# Patient Record
Sex: Female | Born: 1990 | Race: Black or African American | Hispanic: No | Marital: Single | Smoking: Former smoker
Health system: Southern US, Community
[De-identification: ages and names within clinical notes are randomized; demographics above are authoritative.]

## PROBLEM LIST (undated history)

## (undated) ENCOUNTER — Inpatient Hospital Stay (HOSPITAL_COMMUNITY): Payer: Self-pay

## (undated) DIAGNOSIS — O24419 Gestational diabetes mellitus in pregnancy, unspecified control: Secondary | ICD-10-CM

## (undated) DIAGNOSIS — A599 Trichomoniasis, unspecified: Secondary | ICD-10-CM

## (undated) HISTORY — PX: INDUCED ABORTION: SHX677

---
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
## 2010-01-19 ENCOUNTER — Emergency Department (HOSPITAL_COMMUNITY): Admission: EM | Admit: 2010-01-19 | Discharge: 2010-01-19 | Payer: Self-pay | Admitting: Emergency Medicine

## 2010-03-03 ENCOUNTER — Ambulatory Visit: Payer: Self-pay | Admitting: Obstetrics and Gynecology

## 2010-03-03 ENCOUNTER — Inpatient Hospital Stay (HOSPITAL_COMMUNITY): Admission: AD | Admit: 2010-03-03 | Discharge: 2010-03-03 | Payer: Self-pay | Admitting: Obstetrics & Gynecology

## 2010-06-03 ENCOUNTER — Emergency Department (HOSPITAL_COMMUNITY): Admission: EM | Admit: 2010-06-03 | Discharge: 2010-06-03 | Payer: Self-pay | Admitting: Emergency Medicine

## 2010-06-28 ENCOUNTER — Ambulatory Visit: Payer: Self-pay | Admitting: Family

## 2010-06-28 ENCOUNTER — Inpatient Hospital Stay (HOSPITAL_COMMUNITY): Admission: AD | Admit: 2010-06-28 | Discharge: 2010-06-29 | Payer: Self-pay | Admitting: Obstetrics & Gynecology

## 2010-06-30 ENCOUNTER — Inpatient Hospital Stay (HOSPITAL_COMMUNITY): Admission: AD | Admit: 2010-06-30 | Discharge: 2010-06-30 | Payer: Self-pay | Admitting: Family Medicine

## 2010-06-30 ENCOUNTER — Ambulatory Visit: Payer: Self-pay | Admitting: Gynecology

## 2010-10-17 ENCOUNTER — Encounter: Payer: Self-pay | Admitting: Obstetrics & Gynecology

## 2010-12-10 LAB — WET PREP, GENITAL: Trich, Wet Prep: NONE SEEN

## 2010-12-10 LAB — CBC
HCT: 34.8 % — ABNORMAL LOW (ref 36.0–46.0)
MCH: 30.5 pg (ref 26.0–34.0)
MCV: 90 fL (ref 78.0–100.0)
Platelets: 193 10*3/uL (ref 150–400)
RBC: 3.86 MIL/uL — ABNORMAL LOW (ref 3.87–5.11)
WBC: 5.4 10*3/uL (ref 4.0–10.5)

## 2010-12-10 LAB — GC/CHLAMYDIA PROBE AMP, GENITAL
Chlamydia, DNA Probe: NEGATIVE
GC Probe Amp, Genital: NEGATIVE

## 2010-12-14 LAB — URINE MICROSCOPIC-ADD ON

## 2010-12-14 LAB — URINALYSIS, ROUTINE W REFLEX MICROSCOPIC
Bilirubin Urine: NEGATIVE
Glucose, UA: NEGATIVE mg/dL
Ketones, ur: 15 mg/dL — AB
Protein, ur: NEGATIVE mg/dL
Urobilinogen, UA: 0.2 mg/dL (ref 0.0–1.0)

## 2010-12-14 LAB — WET PREP, GENITAL: Yeast Wet Prep HPF POC: NONE SEEN

## 2010-12-14 LAB — CBC
HCT: 33.5 % — ABNORMAL LOW (ref 36.0–46.0)
Hemoglobin: 11.5 g/dL — ABNORMAL LOW (ref 12.0–15.0)
MCV: 89.8 fL (ref 78.0–100.0)
RDW: 13.1 % (ref 11.5–15.5)

## 2010-12-14 LAB — POCT PREGNANCY, URINE: Preg Test, Ur: NEGATIVE

## 2010-12-14 LAB — GC/CHLAMYDIA PROBE AMP, GENITAL
Chlamydia, DNA Probe: NEGATIVE
GC Probe Amp, Genital: NEGATIVE

## 2010-12-15 LAB — URINALYSIS, ROUTINE W REFLEX MICROSCOPIC
Bilirubin Urine: NEGATIVE
Hgb urine dipstick: NEGATIVE
Ketones, ur: NEGATIVE mg/dL
Specific Gravity, Urine: 1.011 (ref 1.005–1.030)
Urobilinogen, UA: 0.2 mg/dL (ref 0.0–1.0)

## 2010-12-15 LAB — URINE MICROSCOPIC-ADD ON

## 2010-12-15 LAB — POCT PREGNANCY, URINE: Preg Test, Ur: NEGATIVE

## 2010-12-29 LAB — POCT PREGNANCY, URINE: Preg Test, Ur: NEGATIVE

## 2011-01-23 ENCOUNTER — Emergency Department (HOSPITAL_COMMUNITY): Payer: 59

## 2011-01-23 ENCOUNTER — Emergency Department (HOSPITAL_COMMUNITY)
Admission: EM | Admit: 2011-01-23 | Discharge: 2011-01-23 | Disposition: A | Discharge status: Home / Self Care | Payer: 59 | Attending: Emergency Medicine | Admitting: Emergency Medicine

## 2011-01-23 DIAGNOSIS — O99891 Other specified diseases and conditions complicating pregnancy: Secondary | ICD-10-CM | POA: Insufficient documentation

## 2011-01-23 DIAGNOSIS — R109 Unspecified abdominal pain: Secondary | ICD-10-CM | POA: Insufficient documentation

## 2011-01-23 LAB — URINALYSIS, ROUTINE W REFLEX MICROSCOPIC
Ketones, ur: NEGATIVE mg/dL
Nitrite: NEGATIVE
Urobilinogen, UA: 1 mg/dL (ref 0.0–1.0)
pH: 7 (ref 5.0–8.0)

## 2011-01-23 LAB — DIFFERENTIAL
Basophils Absolute: 0 10*3/uL (ref 0.0–0.1)
Basophils Relative: 0 % (ref 0–1)
Lymphocytes Relative: 37 % (ref 12–46)
Monocytes Absolute: 0.4 10*3/uL (ref 0.1–1.0)
Neutro Abs: 3.6 10*3/uL (ref 1.7–7.7)
Neutrophils Relative %: 52 % (ref 43–77)

## 2011-01-23 LAB — WET PREP, GENITAL
Trich, Wet Prep: NONE SEEN
WBC, Wet Prep HPF POC: NONE SEEN

## 2011-01-23 LAB — PREGNANCY, URINE: Preg Test, Ur: POSITIVE

## 2011-01-23 LAB — CBC
HCT: 33.6 % — ABNORMAL LOW (ref 36.0–46.0)
Hemoglobin: 11.3 g/dL — ABNORMAL LOW (ref 12.0–15.0)
RBC: 3.93 MIL/uL (ref 3.87–5.11)
WBC: 6.8 10*3/uL (ref 4.0–10.5)

## 2011-01-23 LAB — ABO/RH: ABO/RH(D): O POS

## 2011-01-23 LAB — HCG, QUANTITATIVE, PREGNANCY: hCG, Beta Chain, Quant, S: 711 m[IU]/mL — ABNORMAL HIGH (ref <5)

## 2011-01-23 LAB — POCT I-STAT, CHEM 8
Calcium, Ion: 1.05 mmol/L — ABNORMAL LOW (ref 1.12–1.32)
Chloride: 107 mEq/L (ref 96–112)
HCT: 36 % (ref 36.0–46.0)
Hemoglobin: 12.2 g/dL (ref 12.0–15.0)
Potassium: 3.5 mEq/L (ref 3.5–5.1)

## 2011-01-23 LAB — URINE MICROSCOPIC-ADD ON

## 2011-01-25 LAB — GC/CHLAMYDIA PROBE AMP, GENITAL
Chlamydia, DNA Probe: NEGATIVE
GC Probe Amp, Genital: NEGATIVE

## 2011-05-28 ENCOUNTER — Encounter (HOSPITAL_COMMUNITY): Payer: Self-pay | Admitting: *Deleted

## 2011-05-28 ENCOUNTER — Inpatient Hospital Stay (HOSPITAL_COMMUNITY)
Admission: AD | Admit: 2011-05-28 | Discharge: 2011-05-28 | Disposition: A | Discharge status: Home / Self Care | Payer: 59 | Source: Ambulatory Visit | Attending: Obstetrics and Gynecology | Admitting: Obstetrics and Gynecology

## 2011-05-28 DIAGNOSIS — M545 Low back pain, unspecified: Secondary | ICD-10-CM | POA: Insufficient documentation

## 2011-05-28 DIAGNOSIS — M899 Disorder of bone, unspecified: Secondary | ICD-10-CM | POA: Insufficient documentation

## 2011-05-28 DIAGNOSIS — M259 Joint disorder, unspecified: Secondary | ICD-10-CM | POA: Insufficient documentation

## 2011-05-28 DIAGNOSIS — Z3201 Encounter for pregnancy test, result positive: Secondary | ICD-10-CM

## 2011-05-28 DIAGNOSIS — J45909 Unspecified asthma, uncomplicated: Secondary | ICD-10-CM | POA: Insufficient documentation
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
## 2011-06-10 NOTE — ED Provider Notes (Signed)
Agree with above note.  Jeanenne Licea 06/10/2011 8:32 AM   

## 2011-06-16 ENCOUNTER — Inpatient Hospital Stay (HOSPITAL_COMMUNITY)
Admission: AD | Admit: 2011-06-16 | Discharge: 2011-06-16 | Disposition: A | Discharge status: Home / Self Care | Payer: 59 | Source: Ambulatory Visit | Attending: Obstetrics & Gynecology | Admitting: Obstetrics & Gynecology

## 2011-06-16 ENCOUNTER — Encounter (HOSPITAL_COMMUNITY): Payer: Self-pay | Admitting: *Deleted

## 2011-06-16 DIAGNOSIS — R109 Unspecified abdominal pain: Secondary | ICD-10-CM | POA: Insufficient documentation

## 2011-06-16 DIAGNOSIS — Z3490 Encounter for supervision of normal pregnancy, unspecified, unspecified trimester: Secondary | ICD-10-CM

## 2011-06-16 DIAGNOSIS — Z8759 Personal history of other complications of pregnancy, childbirth and the puerperium: Secondary | ICD-10-CM

## 2011-06-16 DIAGNOSIS — N760 Acute vaginitis: Secondary | ICD-10-CM | POA: Diagnosis present

## 2011-06-16 DIAGNOSIS — B9689 Other specified bacterial agents as the cause of diseases classified elsewhere: Secondary | ICD-10-CM | POA: Diagnosis present

## 2011-06-16 DIAGNOSIS — O2390 Unspecified genitourinary tract infection in pregnancy, unspecified trimester: Secondary | ICD-10-CM | POA: Insufficient documentation

## 2011-06-16 DIAGNOSIS — A499 Bacterial infection, unspecified: Secondary | ICD-10-CM

## 2011-06-16 LAB — URINALYSIS, ROUTINE W REFLEX MICROSCOPIC
Glucose, UA: NEGATIVE mg/dL
Ketones, ur: NEGATIVE mg/dL
Leukocytes, UA: NEGATIVE
Protein, ur: NEGATIVE mg/dL
Urobilinogen, UA: 0.2 mg/dL (ref 0.0–1.0)

## 2011-06-16 LAB — WET PREP, GENITAL: Trich, Wet Prep: NONE SEEN

## 2011-06-16 MED ORDER — METRONIDAZOLE 500 MG PO TABS: 500.0000 mg | ORAL_TABLET | Freq: Two times a day (BID) | ORAL | Status: AC

## 2011-06-16 NOTE — ED Provider Notes (Signed)
History     Chief Complaint  Patient presents with  . Abdominal Pain   HPI Feeling suprapubic/pelvic pressure today, no bleeding or pain. H/O SAB x 2 around [redacted] wks EGA, concerned about another miscarriage.   OB History    Grav Para Term Preterm Abortions TAB SAB Ect Mult Living   3    2 0 2 0 0 0      Past Medical History  Diagnosis Date  . Asthma     Past Surgical History  Procedure Date  . No past surgeries     No family history on file.  History  Substance Use Topics  . Smoking status: Never Smoker   . Smokeless tobacco: Not on file  . Alcohol Use: No    Allergies: No Known Allergies  Prescriptions prior to admission  Medication Sig Dispense Refill  . ibuprofen (ADVIL,MOTRIN) 800 MG tablet Take 800 mg by mouth daily as needed. For pain      . prenatal vitamin w/FE, FA (PRENATAL 1 + 1) 27-1 MG TABS Take 1 tablet by mouth daily.          Review of Systems  Constitutional: Negative.   Respiratory: Negative.   Cardiovascular: Negative.   Gastrointestinal: Negative for nausea, vomiting, abdominal pain, diarrhea and constipation.  Genitourinary: Negative for dysuria, urgency, frequency, hematuria and flank pain.       Negative for vaginal bleeding, cramping/contractions  Musculoskeletal: Negative.   Neurological: Negative.   Psychiatric/Behavioral: Negative.    Physical Exam   Blood pressure 125/73, pulse 81, temperature 98.4 F (36.9 C), temperature source Oral, resp. rate 16, height 5\' 7"  (1.702 m), weight 67.699 kg (149 lb 4 oz), last menstrual period 04/02/2011.  Physical Exam  Nursing note and vitals reviewed. Constitutional: She is oriented to person, place, and time. She appears well-developed and well-nourished. No distress.  HENT:  Head: Normocephalic and atraumatic.  Cardiovascular: Normal rate.   Respiratory: Effort normal.  GI: Soft. Bowel sounds are normal. She exhibits no mass. There is no tenderness. There is no rebound and no guarding.    Genitourinary: There is no rash or lesion on the right labia. There is no rash or lesion on the left labia. Uterus is not tender. Enlarged: Size c/w dates. Cervix exhibits no motion tenderness, no discharge and no friability. Right adnexum displays no mass, no tenderness and no fullness. Left adnexum displays no mass, no tenderness and no fullness. No tenderness or bleeding around the vagina. Vaginal discharge (white) found.  Musculoskeletal: Normal range of motion.  Neurological: She is alert and oriented to person, place, and time.  Skin: Skin is warm and dry.  Psychiatric: She has a normal mood and affect.   + cardiac activity on bedside u/s  MAU Course  Procedures  Results for orders placed during the hospital encounter of 06/16/11 (from the past 24 hour(s))  URINALYSIS, ROUTINE W REFLEX MICROSCOPIC     Status: Normal   Collection Time   06/16/11  1:56 PM      Component Value Range   Color, Urine YELLOW  YELLOW    Appearance CLEAR  CLEAR    Specific Gravity, Urine 1.020  1.005 - 1.030    pH 7.0  5.0 - 8.0    Glucose, UA NEGATIVE  NEGATIVE (mg/dL)   Hgb urine dipstick NEGATIVE  NEGATIVE    Bilirubin Urine NEGATIVE  NEGATIVE    Ketones, ur NEGATIVE  NEGATIVE (mg/dL)   Protein, ur NEGATIVE  NEGATIVE (mg/dL)  Urobilinogen, UA 0.2  0.0 - 1.0 (mg/dL)   Nitrite NEGATIVE  NEGATIVE    Leukocytes, UA NEGATIVE  NEGATIVE      Assessment and Plan  20 y.o. [id number] at [redacted]w[redacted]d Bacterial vaginosis - rx Flagyl 500 mg bid x 7 days Start prenatal care as soon as possible  Whitney Howell 06/16/2011, 2:34 PM

## 2011-06-16 NOTE — Progress Notes (Signed)
Pt states 2 prior miscarriages around 10 weeks, is 10 weeks now, feels suprapubic pressure, denies pain or bleeding or abnormal vaginal discharge. Concerned she may miscarry again.

## 2011-06-16 NOTE — Progress Notes (Signed)
Pt in worried about a miscarriage.  Has been feeling a lot of pelvic pressure today since 0500.  Denies any bleeding or cramping.  Hx of 2 SAB at 10 weeks.

## 2011-06-17 LAB — GC/CHLAMYDIA PROBE AMP, GENITAL: GC Probe Amp, Genital: NEGATIVE

## 2011-09-06 ENCOUNTER — Encounter (HOSPITAL_COMMUNITY): Payer: Self-pay | Admitting: *Deleted

## 2011-09-06 ENCOUNTER — Inpatient Hospital Stay (HOSPITAL_COMMUNITY)
Admission: AD | Admit: 2011-09-06 | Discharge: 2011-09-06 | Disposition: A | Discharge status: Home / Self Care | Payer: 59 | Source: Ambulatory Visit | Attending: Obstetrics & Gynecology | Admitting: Obstetrics & Gynecology

## 2011-09-06 DIAGNOSIS — O219 Vomiting of pregnancy, unspecified: Secondary | ICD-10-CM

## 2011-09-06 DIAGNOSIS — O99891 Other specified diseases and conditions complicating pregnancy: Secondary | ICD-10-CM | POA: Insufficient documentation

## 2011-09-06 DIAGNOSIS — J069 Acute upper respiratory infection, unspecified: Secondary | ICD-10-CM | POA: Insufficient documentation

## 2011-09-06 DIAGNOSIS — O210 Mild hyperemesis gravidarum: Secondary | ICD-10-CM | POA: Insufficient documentation

## 2011-09-06 HISTORY — DX: Gestational diabetes mellitus in pregnancy, unspecified control: O24.419

## 2011-09-06 LAB — URINALYSIS, ROUTINE W REFLEX MICROSCOPIC
Glucose, UA: NEGATIVE mg/dL
Hgb urine dipstick: NEGATIVE
Protein, ur: NEGATIVE mg/dL
Specific Gravity, Urine: >1.03 — ABNORMAL HIGH (ref 1.005–1.030)
Urobilinogen, UA: 0.2 mg/dL (ref 0.0–1.0)

## 2011-09-06 LAB — CBC
HCT: 29.2 % — ABNORMAL LOW (ref 36.0–46.0)
Platelets: 220 10*3/uL (ref 150–400)
RBC: 3.3 MIL/uL — ABNORMAL LOW (ref 3.87–5.11)
RDW: 13.5 % (ref 11.5–15.5)
WBC: 8 10*3/uL (ref 4.0–10.5)

## 2011-09-06 LAB — DIFFERENTIAL
Basophils Absolute: 0 10*3/uL (ref 0.0–0.1)
Lymphocytes Relative: 20 % (ref 12–46)
Lymphs Abs: 1.6 10*3/uL (ref 0.7–4.0)
Neutro Abs: 5.8 10*3/uL (ref 1.7–7.7)

## 2011-09-06 LAB — COMPREHENSIVE METABOLIC PANEL
AST: 15 U/L (ref 0–37)
Alkaline Phosphatase: 64 U/L (ref 39–117)
BUN: 8 mg/dL (ref 6–23)
CO2: 24 mEq/L (ref 19–32)
Chloride: 100 mEq/L (ref 96–112)
Creatinine, Ser: 0.63 mg/dL (ref 0.50–1.10)
GFR calc non Af Amer: >90 mL/min (ref >90)
Total Bilirubin: 0.2 mg/dL — ABNORMAL LOW (ref 0.3–1.2)

## 2011-09-06 LAB — URINE MICROSCOPIC-ADD ON

## 2011-09-06 MED ORDER — AZITHROMYCIN 250 MG PO TABS: ORAL_TABLET | ORAL | Status: AC

## 2011-09-06 MED ORDER — ONDANSETRON 8 MG PO TBDP
8.0000 mg | ORAL_TABLET | Freq: Once | ORAL | Status: AC
  Administered 2011-09-06: 8 mg via ORAL
  Filled 2011-09-06: qty 1

## 2011-09-06 NOTE — Progress Notes (Signed)
Pt tolerating juice and crackers.

## 2011-09-06 NOTE — Progress Notes (Signed)
Pt had an upper respiratory infection for one month, and never went to MD to be checked.  Cough continues with green sputum, unable to keep fluids or food down for two days.

## 2011-09-06 NOTE — ED Provider Notes (Signed)
History     Chief Complaint  Patient presents with  . Emesis  . Headache  . Abdominal Pain   HPI 20 y.o. G3P0020 at [redacted]w[redacted]d. Congestion, sore throat and productive cough x 1 month, no fever, not taking anything for symptoms.  N/V x 2 days, has zofran at home but has not tried it. + fetal movement.     Past Medical History  Diagnosis Date  . Asthma   . Shortness of breath   . Gestational diabetes     Past Surgical History  Procedure Date  . No past surgeries     Family History  Problem Relation Age of Onset  . Cancer Mother   . Asthma Father   . Asthma Sister   . Diabetes Brother   . Asthma Brother   . Cancer Maternal Grandmother   . Heart disease Maternal Grandfather   . Hypertension Maternal Grandfather   . Diabetes Paternal Grandfather     History  Substance Use Topics  . Smoking status: Never Smoker   . Smokeless tobacco: Not on file  . Alcohol Use: No    Allergies: No Known Allergies  Prescriptions prior to admission  Medication Sig Dispense Refill  . Fe-Succ Ac-B Cmplx-C-Ca-FA (IROSPAN 24/6 PO) Take 1 tablet by mouth daily.        Burnis Medin w/o A Vit-FeFum-FePo-FA (CONCEPT OB PO) Take 1 tablet by mouth daily.          Review of Systems  Constitutional: Negative for fever.  HENT: Positive for congestion and sore throat.   Respiratory: Positive for cough.   Cardiovascular: Negative.   Gastrointestinal: Positive for nausea, vomiting and abdominal pain. Negative for diarrhea and constipation.  Genitourinary: Negative for dysuria, urgency, frequency, hematuria and flank pain.       Negative for vaginal bleeding, cramping/contractions  Musculoskeletal: Negative.   Neurological: Negative.   Psychiatric/Behavioral: Negative.    Physical Exam   Blood pressure 116/59, pulse 97, temperature 99 F (37.2 C), temperature source Oral, resp. rate 16, height 5' 6.5" (1.689 m), weight 157 lb 9.6 oz (71.487 kg), last menstrual period 04/02/2011, SpO2  99.00%.  Physical Exam  Nursing note and vitals reviewed. Constitutional: She is oriented to person, place, and time. She appears well-developed and well-nourished. No distress.  HENT:  Right Ear: Tympanic membrane, external ear and ear canal normal.  Left Ear: Tympanic membrane, external ear and ear canal normal. Decreased hearing is noted.  Nose: Nose normal.  Mouth/Throat: Oropharynx is clear and moist. No oropharyngeal exudate.  Neck: Normal range of motion.  Cardiovascular: Normal rate, regular rhythm and normal heart sounds.   Respiratory: Effort normal. No stridor. No respiratory distress. She has no wheezes. She has no rales.  GI: Soft. She exhibits no distension and no mass. There is no tenderness. There is no rebound and no guarding.  Musculoskeletal: Normal range of motion.  Lymphadenopathy:    She has no cervical adenopathy.  Neurological: She is alert and oriented to person, place, and time.  Skin: Skin is warm and dry.  Psychiatric: She has a normal mood and affect.    MAU Course  Procedures Results for orders placed during the hospital encounter of 09/06/11 (from the past 24 hour(s))  URINALYSIS, ROUTINE W REFLEX MICROSCOPIC     Status: Abnormal   Collection Time   09/06/11  3:10 PM      Component Value Range   Color, Urine YELLOW  YELLOW    APPearance CLEAR  CLEAR  Specific Gravity, Urine >1.030 (*) 1.005 - 1.030    pH 6.5  5.0 - 8.0    Glucose, UA NEGATIVE  NEGATIVE (mg/dL)   Hgb urine dipstick NEGATIVE  NEGATIVE    Bilirubin Urine NEGATIVE  NEGATIVE    Ketones, ur NEGATIVE  NEGATIVE (mg/dL)   Protein, ur NEGATIVE  NEGATIVE (mg/dL)   Urobilinogen, UA 0.2  0.0 - 1.0 (mg/dL)   Nitrite NEGATIVE  NEGATIVE    Leukocytes, UA TRACE (*) NEGATIVE   URINE MICROSCOPIC-ADD ON     Status: Abnormal   Collection Time   09/06/11  3:10 PM      Component Value Range   Squamous Epithelial / LPF FEW (*) RARE    WBC, UA 3-6  <3 (WBC/hpf)   RBC / HPF 0-2  <3 (RBC/hpf)    Bacteria, UA MANY (*) RARE    Urine-Other MUCOUS PRESENT    CBC     Status: Abnormal   Collection Time   09/06/11  3:34 PM      Component Value Range   WBC 8.0  4.0 - 10.5 (K/uL)   RBC 3.30 (*) 3.87 - 5.11 (MIL/uL)   Hemoglobin 9.9 (*) 12.0 - 15.0 (g/dL)   HCT 16.1 (*) 09.6 - 46.0 (%)   MCV 88.5  78.0 - 100.0 (fL)   MCH 30.0  26.0 - 34.0 (pg)   MCHC 33.9  30.0 - 36.0 (g/dL)   RDW 04.5  40.9 - 81.1 (%)   Platelets 220  150 - 400 (K/uL)  DIFFERENTIAL     Status: Normal   Collection Time   09/06/11  3:34 PM      Component Value Range   Neutrophils Relative 73  43 - 77 (%)   Neutro Abs 5.8  1.7 - 7.7 (K/uL)   Lymphocytes Relative 20  12 - 46 (%)   Lymphs Abs 1.6  0.7 - 4.0 (K/uL)   Monocytes Relative 6  3 - 12 (%)   Monocytes Absolute 0.5  0.1 - 1.0 (K/uL)   Eosinophils Relative 2  0 - 5 (%)   Eosinophils Absolute 0.1  0.0 - 0.7 (K/uL)   Basophils Relative 0  0 - 1 (%)   Basophils Absolute 0.0  0.0 - 0.1 (K/uL)  COMPREHENSIVE METABOLIC PANEL     Status: Abnormal   Collection Time   09/06/11  3:34 PM      Component Value Range   Sodium 134 (*) 135 - 145 (mEq/L)   Potassium 3.8  3.5 - 5.1 (mEq/L)   Chloride 100  96 - 112 (mEq/L)   CO2 24  19 - 32 (mEq/L)   Glucose, Bld 81  70 - 99 (mg/dL)   BUN 8  6 - 23 (mg/dL)   Creatinine, Ser 9.14  0.50 - 1.10 (mg/dL)   Calcium 8.8  8.4 - 78.2 (mg/dL)   Total Protein 7.4  6.0 - 8.3 (g/dL)   Albumin 3.2 (*) 3.5 - 5.2 (g/dL)   AST 15  0 - 37 (U/L)   ALT 10  0 - 35 (U/L)   Alkaline Phosphatase 64  39 - 117 (U/L)   Total Bilirubin 0.2 (*) 0.3 - 1.2 (mg/dL)   GFR calc non Af Amer >90  >90 (mL/min)   GFR calc Af Amer >90  >90 (mL/min)   Feeling much better after Zofran ODT, tolerating juice, requesting crackers. If able to tolerate PO, will d/c home with abx for URI. Has Zofran at home.   Assessment and  Plan  20 y.o. G3P0020 at [redacted]w[redacted]d Nausea and vomiting - take Zofran as prescribed at home URI x 1 month - Z-pak F/U as  scheduled  Chrishun Scheer 09/06/2011, 4:03 PM

## 2011-09-06 NOTE — Progress Notes (Signed)
Patient states she has had some vomiting for about one month. Today has been unable to keep anything down and has a headache and mid abdominal pain.

## 2011-09-28 NOTE — L&D Delivery Note (Signed)
Delivery Note At 11:59 PM a viable female was delivered via Vaginal, Spontaneous Delivery (Presentation: Left Occiput Anterior).    Placenta status: Intact, Spontaneous.  Cord: 3 vessels with the following complications: None.  Cord pH: none.  Nuchal cord x 1 clamped/cut prior to delivery of anterior shoulder.  Anesthesia: Epidural  Episiotomy: None Lacerations: 1st degree;Labial Suture Repair: 2.0 3.0 vicryl rapide Est. Blood Loss (mL): 300 ml  Mom to postpartum.  Baby to nursery-stable.  JACKSON-MOORE,Caddie Randle A 01/06/2012, 12:27 AM

## 2011-12-16 ENCOUNTER — Inpatient Hospital Stay (HOSPITAL_COMMUNITY)
Admission: AD | Admit: 2011-12-16 | Discharge: 2011-12-16 | Disposition: A | Discharge status: Home / Self Care | Payer: 59 | Source: Ambulatory Visit | Attending: Obstetrics | Admitting: Obstetrics

## 2011-12-16 ENCOUNTER — Encounter (HOSPITAL_COMMUNITY): Payer: Self-pay | Admitting: *Deleted

## 2011-12-16 DIAGNOSIS — O99810 Abnormal glucose complicating pregnancy: Secondary | ICD-10-CM

## 2011-12-16 DIAGNOSIS — R0602 Shortness of breath: Secondary | ICD-10-CM

## 2011-12-16 DIAGNOSIS — R209 Unspecified disturbances of skin sensation: Secondary | ICD-10-CM | POA: Insufficient documentation

## 2011-12-16 NOTE — Discharge Instructions (Signed)
Shortness of Breath  Shortness of breath (dyspnea) is the feeling of uneasy breathing. Shortness of breath needs care right away.  HOME CARE    Do not smoke.   Avoid being around chemicals that may bother your breathing (such as paint fumes or dust).   Rest as needed. Slowly begin your usual activities.   Only take medicine as told by your doctor. Inhaled medicines or oxygen might be part of your treatment.   Follow up with your doctor as told. Waiting to do so or failure to follow up could result in worsening your condition and possible disability or death.   Understand what to do or who to call if your shortness of breath gets worse.  GET HELP RIGHT AWAY IF:    You get pain in your chest, shoulders, belly (abdomen), or jaw.   You cannot stop coughing or you start wheezing.   You cough up blood or thick mucus.   You can only speak with short words.   You have a fever.   You feel your heart racing or skipping beats.   You are not breathing better when you stop and rest.   Your condition does not improve in the time expected.   You have problems with medicines.  MAKE SURE YOU:   Understand these instructions.   Will watch your condition.   Will get help right away if you are not doing well or get worse.  Document Released: 03/01/2008 Document Revised: 09/02/2011 Document Reviewed: 07/30/2009  [employer] Patient Information 2012 [employer]

## 2011-12-16 NOTE — MAU Note (Signed)
resp unlabored.  Talks without need to pause or stop

## 2011-12-16 NOTE — MAU Provider Note (Signed)
History   Pt presents today c/o SOB. She states it feels as if she "can't catch a deep breath." She denies LOC, chest pain, blurry vision, or any other problems at this time.   CSN: [id number]  Arrival date and time: 12/16/11 1038   First Provider Initiated Contact with Patient 12/16/11 1131      No chief complaint on file.  HPI  OB History    Grav Para Term Preterm Abortions TAB SAB Ect Mult Living   3    2 0 2 0 0 0      Past Medical History  Diagnosis Date  . Asthma   . Shortness of breath   . Gestational diabetes     Past Surgical History  Procedure Date  . No past surgeries     Family History  Problem Relation Age of Onset  . Cancer Mother   . Asthma Father   . Asthma Sister   . Diabetes Brother   . Asthma Brother   . Cancer Maternal Grandmother   . Heart disease Maternal Grandfather   . Hypertension Maternal Grandfather   . Diabetes Paternal Grandfather     History  Substance Use Topics  . Smoking status: Never Smoker   . Smokeless tobacco: Not on file  . Alcohol Use: No    Allergies: No Known Allergies  Prescriptions prior to admission  Medication Sig Dispense Refill  . prenatal vitamin w/FE, FA (NATACHEW) 29-1 MG CHEW Chew 1 tablet by mouth every morning.        Review of Systems  Constitutional: Negative for fever and chills.  Eyes: Negative for blurred vision and double vision.  Respiratory: Positive for shortness of breath. Negative for cough, hemoptysis, sputum production and wheezing.   Cardiovascular: Negative for chest pain, palpitations, orthopnea, claudication and leg swelling.  Gastrointestinal: Negative for nausea, vomiting, abdominal pain, diarrhea and constipation.  Genitourinary: Negative for dysuria, urgency, frequency and hematuria.  Neurological: Negative for dizziness and headaches.  Psychiatric/Behavioral: Negative for depression and suicidal ideas.   Physical Exam   Blood pressure 124/76, pulse 92, temperature 97.1 F  (36.2 C), temperature source Oral, resp. rate 20, height 5\' 8"  (1.727 m), weight 181 lb (82.101 kg), last menstrual period 04/02/2011, SpO2 100.00%.  Physical Exam  Nursing note and vitals reviewed. Constitutional: She is oriented to person, place, and time. She appears well-developed and well-nourished. No distress.  HENT:  Head: Normocephalic and atraumatic.  Eyes: EOM are normal. Pupils are equal, round, and reactive to light.  Cardiovascular: Normal rate, regular rhythm and normal heart sounds.  Exam reveals no gallop and no friction rub.   No murmur heard. Respiratory: Effort normal and breath sounds normal. No respiratory distress. She has no wheezes. She has no rales. She exhibits no tenderness.  GI: Soft. She exhibits no distension. There is no tenderness. There is no rebound and no guarding.  Neurological: She is alert and oriented to person, place, and time.  Skin: Skin is warm and dry. She is not diaphoretic.  Psychiatric: She has a normal mood and affect. Her behavior is normal. Judgment and thought content normal.    MAU Course  Procedures  O2 sat 100% on room air.  NST reactive.  Discussed pt with Dr. Clearance Coots. Ok to dc to home and have pt f/u in office.   Assessment and Plan  SOB: pt with SOB from expansion restriction. No evidence of PE, pneumonia or any other respiratory issue. Discussed diet, activity, risks, and precautions. She  will f/u with Dr. Clearance Coots.  Clinton Gallant. Nasario Czerniak III, DrHSc, MPAS, PA-C  12/16/2011, 11:36 AM

## 2011-12-16 NOTE — MAU Note (Addendum)
For past wk has been waking up gasping for breath, hands and feet will be tingling.  Currently has tingling in feet, denies in hands or SOB.  Did feel short of breath when walking in, fine once she sat down. Denies HA, visual changes or epigastric pain. Does report increased swelling in hands and feet.

## 2012-01-04 ENCOUNTER — Encounter (HOSPITAL_COMMUNITY): Payer: Self-pay | Admitting: *Deleted

## 2012-01-04 ENCOUNTER — Inpatient Hospital Stay (HOSPITAL_COMMUNITY)
Admission: AD | Admit: 2012-01-04 | Discharge: 2012-01-04 | Disposition: A | Discharge status: Home / Self Care | Payer: 59 | Source: Ambulatory Visit | Attending: Obstetrics | Admitting: Obstetrics

## 2012-01-04 DIAGNOSIS — O479 False labor, unspecified: Secondary | ICD-10-CM | POA: Insufficient documentation

## 2012-01-04 LAB — URINALYSIS, ROUTINE W REFLEX MICROSCOPIC
Glucose, UA: NEGATIVE mg/dL
Ketones, ur: NEGATIVE mg/dL
Specific Gravity, Urine: 1.01 (ref 1.005–1.030)
pH: 6.5 (ref 5.0–8.0)

## 2012-01-04 LAB — URINE MICROSCOPIC-ADD ON

## 2012-01-04 NOTE — MAU Note (Signed)
Pt G3 P0 at 39.4wks having contractions every 5-12min and vomiting, unable to keep anything down.   Gest diabetic-diet control.

## 2012-01-04 NOTE — Discharge Instructions (Signed)
Pregnancy - Third Trimester The third trimester of pregnancy (the last 3 months) is a period of the most rapid growth for you and your baby. The baby approaches a length of 20 inches and a weight of 6 to 10 pounds. The baby is adding on fat and getting ready for life outside your body. While inside, babies have periods of sleeping and waking, suck their thumbs, and hiccups. You can often feel small contractions of the uterus. This is false labor. It is also called Braxton-Hicks contractions. This is like a practice for labor. The usual problems in this stage of pregnancy include more difficulty breathing, swelling of the hands and feet from water retention, and having to urinate more often because of the uterus and baby pressing on your bladder.  PRENATAL EXAMS  Blood work may continue to be done during prenatal exams. These tests are done to check on your health and the probable health of your baby. Blood work is used to follow your blood levels (hemoglobin). Anemia (low hemoglobin) is common during pregnancy. Iron and vitamins are given to help prevent this. You may also continue to be checked for diabetes. Some of the past blood tests may be done again.   The size of the uterus is measured during each visit. This makes sure your baby is growing properly according to your pregnancy dates.   Your blood pressure is checked every prenatal visit. This is to make sure you are not getting toxemia.   Your urine is checked every prenatal visit for infection, diabetes and protein.   Your weight is checked at each visit. This is done to make sure gains are happening at the suggested rate and that you and your baby are growing normally.   Sometimes, an ultrasound is performed to confirm the position and the proper growth and development of the baby. This is a test done that bounces harmless sound waves off the baby so your caregiver can more accurately determine due dates.   Discuss the type of pain  medication and anesthesia you will have during your labor and delivery.   Discuss the possibility and anesthesia if a Cesarean Section might be necessary.   Inform your caregiver if there is any mental or physical violence at home.  Sometimes, a specialized non-stress test, contraction stress test and biophysical profile are done to make sure the baby is not having a problem. Checking the amniotic fluid surrounding the baby is called an amniocentesis. The amniotic fluid is removed by sticking a needle into the belly (abdomen). This is sometimes done near the end of pregnancy if an early delivery is required. In this case, it is done to help make sure the baby's lungs are mature enough for the baby to live outside of the womb. If the lungs are not mature and it is unsafe to deliver the baby, an injection of cortisone medication is given to the mother 1 to 2 days before the delivery. This helps the baby's lungs mature and makes it safer to deliver the baby. CHANGES OCCURING IN THE THIRD TRIMESTER OF PREGNANCY Your body goes through many changes during pregnancy. They vary from person to person. Talk to your caregiver about changes you notice and are concerned about.  During the last trimester, you have probably had an increase in your appetite. It is normal to have cravings for certain foods. This varies from person to person and pregnancy to pregnancy.   You may begin to get stretch marks on your hips,   abdomen, and breasts. These are normal changes in the body during pregnancy. There are no exercises or medications to take which prevent this change.   Constipation may be treated with a stool softener or adding bulk to your diet. Drinking lots of fluids, fiber in vegetables, fruits, and whole grains are helpful.   Exercising is also helpful. If you have been very active up until your pregnancy, most of these activities can be continued during your pregnancy. If you have been less active, it is helpful  to start an exercise program such as walking. Consult your caregiver before starting exercise programs.   Avoid all smoking, alcohol, un-prescribed drugs, herbs and "street drugs" during your pregnancy. These chemicals affect the formation and growth of the baby. Avoid chemicals throughout the pregnancy to ensure the delivery of a healthy infant.   Backache, varicose veins and hemorrhoids may develop or get worse.   You will tire more easily in the third trimester, which is normal.   The baby's movements may be stronger and more often.   You may become short of breath easily.   Your belly button may stick out.   A yellow discharge may leak from your breasts called colostrum.   You may have a bloody mucus discharge. This usually occurs a few days to a week before labor begins.  HOME CARE INSTRUCTIONS   Keep your caregiver's appointments. Follow your caregiver's instructions regarding medication use, exercise, and diet.   During pregnancy, you are providing food for you and your baby. Continue to eat regular, well-balanced meals. Choose foods such as meat, fish, milk and other low fat dairy products, vegetables, fruits, and whole-grain breads and cereals. Your caregiver will tell you of the ideal weight gain.   A physical sexual relationship may be continued throughout pregnancy if there are no other problems such as early (premature) leaking of amniotic fluid from the membranes, vaginal bleeding, or belly (abdominal) pain.   Exercise regularly if there are no restrictions. Check with your caregiver if you are unsure of the safety of your exercises. Greater weight gain will occur in the last 2 trimesters of pregnancy. Exercising helps:   Control your weight.   Get you in shape for labor and delivery.   You lose weight after you deliver.   Rest a lot with legs elevated, or as needed for leg cramps or low back pain.   Wear a good support or jogging bra for breast tenderness during  pregnancy. This may help if worn during sleep. Pads or tissues may be used in the bra if you are leaking colostrum.   Do not use hot tubs, steam rooms, or saunas.   Wear your seat belt when driving. This protects you and your baby if you are in an accident.   Avoid raw meat, cat litter boxes and soil used by cats. These carry germs that can cause birth defects in the baby.   It is easier to loose urine during pregnancy. Tightening up and strengthening the pelvic muscles will help with this problem. You can practice stopping your urination while you are going to the bathroom. These are the same muscles you need to strengthen. It is also the muscles you would use if you were trying to stop from passing gas. You can practice tightening these muscles up 10 times a set and repeating this about 3 times per day. Once you know what muscles to tighten up, do not perform these exercises during urination. It is more likely   to cause an infection by backing up the urine.   Ask for help if you have financial, counseling or nutritional needs during pregnancy. Your caregiver will be able to offer counseling for these needs as well as refer you for other special needs.   Make a list of emergency phone numbers and have them available.   Plan on getting help from family or friends when you go home from the hospital.   Make a trial run to the hospital.   Take prenatal classes with the father to understand, practice and ask questions about the labor and delivery.   Prepare the baby's room/nursery.   Do not travel out of the city unless it is absolutely necessary and with the advice of your caregiver.   Wear only low or no heal shoes to have better balance and prevent falling.  MEDICATIONS AND DRUG USE IN PREGNANCY  Take prenatal vitamins as directed. The vitamin should contain 1 milligram of folic acid. Keep all vitamins out of reach of children. Only a couple vitamins or tablets containing iron may be fatal  to a baby or young child when ingested.   Avoid use of all medications, including herbs, over-the-counter medications, not prescribed or suggested by your caregiver. Only take over-the-counter or prescription medicines for pain, discomfort, or fever as directed by your caregiver. Do not use aspirin, ibuprofen (Motrin, Advil, Nuprin) or naproxen (Aleve) unless OK'd by your caregiver.   Let your caregiver also know about herbs you may be using.   Alcohol is related to a number of birth defects. This includes fetal alcohol syndrome. All alcohol, in any form, should be avoided completely. Smoking will cause low birth rate and premature babies.   Street/illegal drugs are very harmful to the baby. They are absolutely forbidden. A baby born to an addicted mother will be addicted at birth. The baby will go through the same withdrawal an adult does.  SEEK MEDICAL CARE IF: You have any concerns or worries during your pregnancy. It is better to call with your questions if you feel they cannot wait, rather than worry about them. DECISIONS ABOUT CIRCUMCISION You may or may not know the sex of your baby. If you know your baby is a boy, it may be time to think about circumcision. Circumcision is the removal of the foreskin of the penis. This is the skin that covers the sensitive end of the penis. There is no proven medical need for this. Often this decision is made on what is popular at the time or based upon religious beliefs and social issues. You can discuss these issues with your caregiver or pediatrician. SEEK IMMEDIATE MEDICAL CARE IF:   An unexplained oral temperature above 102 F (38.9 C) develops, or as your caregiver suggests.   You have leaking of fluid from the vagina (birth canal). If leaking membranes are suspected, take your temperature and tell your caregiver of this when you call.   There is vaginal spotting, bleeding or passing clots. Tell your caregiver of the amount and how many pads are  used.   You develop a bad smelling vaginal discharge with a change in the color from clear to white.   You develop vomiting that lasts more than 24 hours.   You develop chills or fever.   You develop shortness of breath.   You develop burning on urination.   You loose more than 2 pounds of weight or gain more than 2 pounds of weight or as suggested by your   caregiver.   You notice sudden swelling of your face, hands, and feet or legs.   You develop belly (abdominal) pain. Round ligament discomfort is a common non-cancerous (benign) cause of abdominal pain in pregnancy. Your caregiver still must evaluate you.   You develop a severe headache that does not go away.   You develop visual problems, blurred or double vision.   If you have not felt your baby move for more than 1 hour. If you think the baby is not moving as much as usual, eat something with sugar in it and lie down on your left side for an hour. The baby should move at least 4 to 5 times per hour. Call right away if your baby moves less than that.   You fall, are in a car accident or any kind of trauma.   There is mental or physical violence at home.          KEEP APPOINTMENT ALREADY SCHEDULED Document Released: 09/07/2001 Document Revised: 09/02/2011 Document Reviewed: 03/12/2009 [employer] Patient Information 2012 [employer]. 

## 2012-01-04 NOTE — Progress Notes (Signed)
Pt found sitting up in the bed. Pt self positioning.maternal heart rate being montiored  Ultrasound repositioned for fetal heart rate.

## 2012-01-05 ENCOUNTER — Encounter (HOSPITAL_COMMUNITY): Payer: Self-pay | Admitting: Anesthesiology

## 2012-01-05 ENCOUNTER — Inpatient Hospital Stay (HOSPITAL_COMMUNITY): Payer: 59 | Admitting: Anesthesiology

## 2012-01-05 ENCOUNTER — Inpatient Hospital Stay (HOSPITAL_COMMUNITY)
Admission: AD | Admit: 2012-01-05 | Discharge: 2012-01-07 | DRG: 775 | Disposition: A | Discharge status: Home / Self Care | Payer: 59 | Source: Ambulatory Visit | Attending: Obstetrics & Gynecology | Admitting: Obstetrics & Gynecology

## 2012-01-05 ENCOUNTER — Encounter (HOSPITAL_COMMUNITY): Payer: Self-pay

## 2012-01-05 DIAGNOSIS — O4700 False labor before 37 completed weeks of gestation, unspecified trimester: Secondary | ICD-10-CM | POA: Diagnosis present

## 2012-01-05 LAB — CBC
MCH: 29.2 pg (ref 26.0–34.0)
MCHC: 32.8 g/dL (ref 30.0–36.0)
Platelets: 211 10*3/uL (ref 150–400)
RDW: 13.3 % (ref 11.5–15.5)

## 2012-01-05 LAB — STREP B DNA PROBE

## 2012-01-05 LAB — RPR: RPR: NONREACTIVE

## 2012-01-05 MED ORDER — OXYTOCIN BOLUS FROM INFUSION
500.0000 mL | Freq: Once | INTRAVENOUS | Status: DC
  Filled 2012-01-05: qty 500

## 2012-01-05 MED ORDER — TERBUTALINE SULFATE 1 MG/ML IJ SOLN: 0.2500 mg | Freq: Once | INTRAMUSCULAR | Status: AC | PRN

## 2012-01-05 MED ORDER — EPHEDRINE 5 MG/ML INJ
10.0000 mg | INTRAVENOUS | Status: DC | PRN
  Filled 2012-01-05: qty 2

## 2012-01-05 MED ORDER — LACTATED RINGERS IV SOLN: 500.0000 mL | Freq: Once | INTRAVENOUS | Status: DC

## 2012-01-05 MED ORDER — EPHEDRINE 5 MG/ML INJ
10.0000 mg | INTRAVENOUS | Status: DC | PRN
  Filled 2012-01-05: qty 2
  Filled 2012-01-05: qty 4

## 2012-01-05 MED ORDER — IBUPROFEN 600 MG PO TABS: 600.0000 mg | ORAL_TABLET | Freq: Four times a day (QID) | ORAL | Status: DC | PRN

## 2012-01-05 MED ORDER — LACTATED RINGERS IV SOLN
INTRAVENOUS | Status: DC
  Administered 2012-01-05: 500 mL via INTRAVENOUS
  Administered 2012-01-05 (×3): via INTRAVENOUS

## 2012-01-05 MED ORDER — FENTANYL 2.5 MCG/ML BUPIVACAINE 1/10 % EPIDURAL INFUSION (WH - ANES)
14.0000 mL/h | INTRAMUSCULAR | Status: DC
  Filled 2012-01-05: qty 60

## 2012-01-05 MED ORDER — CITRIC ACID-SODIUM CITRATE 334-500 MG/5ML PO SOLN: 30.0000 mL | ORAL | Status: DC | PRN

## 2012-01-05 MED ORDER — OXYTOCIN 20 UNITS IN LACTATED RINGERS INFUSION - SIMPLE: 125.0000 mL/h | Freq: Once | INTRAVENOUS | Status: DC

## 2012-01-05 MED ORDER — ACETAMINOPHEN 325 MG PO TABS: 650.0000 mg | ORAL_TABLET | ORAL | Status: DC | PRN

## 2012-01-05 MED ORDER — LACTATED RINGERS IV SOLN: 500.0000 mL | INTRAVENOUS | Status: DC | PRN

## 2012-01-05 MED ORDER — OXYTOCIN 20 UNITS IN LACTATED RINGERS INFUSION - SIMPLE
1.0000 m[IU]/min | INTRAVENOUS | Status: DC
  Administered 2012-01-05: 2 m[IU]/min via INTRAVENOUS
  Filled 2012-01-05: qty 1000

## 2012-01-05 MED ORDER — OXYCODONE-ACETAMINOPHEN 5-325 MG PO TABS: 1.0000 | ORAL_TABLET | ORAL | Status: DC | PRN

## 2012-01-05 MED ORDER — FENTANYL 2.5 MCG/ML BUPIVACAINE 1/10 % EPIDURAL INFUSION (WH - ANES)
INTRAMUSCULAR | Status: DC | PRN
  Administered 2012-01-05: 14 mL/h via EPIDURAL

## 2012-01-05 MED ORDER — PHENYLEPHRINE 40 MCG/ML (10ML) SYRINGE FOR IV PUSH (FOR BLOOD PRESSURE SUPPORT)
80.0000 ug | PREFILLED_SYRINGE | INTRAVENOUS | Status: DC | PRN
  Filled 2012-01-05: qty 2

## 2012-01-05 MED ORDER — LIDOCAINE HCL (PF) 1 % IJ SOLN
30.0000 mL | INTRAMUSCULAR | Status: DC | PRN
  Administered 2012-01-06: 30 mL via SUBCUTANEOUS
  Filled 2012-01-05: qty 30

## 2012-01-05 MED ORDER — BUTORPHANOL TARTRATE 2 MG/ML IJ SOLN
2.0000 mg | INTRAMUSCULAR | Status: DC | PRN
  Administered 2012-01-05: 2 mg via INTRAVENOUS
  Filled 2012-01-05: qty 1

## 2012-01-05 MED ORDER — MISOPROSTOL 25 MCG QUARTER TABLET
75.0000 ug | ORAL_TABLET | Freq: Once | ORAL | Status: AC
  Administered 2012-01-05: 75 ug via ORAL
  Filled 2012-01-05: qty 0.75

## 2012-01-05 MED ORDER — LIDOCAINE HCL (PF) 1 % IJ SOLN
INTRAMUSCULAR | Status: DC | PRN
  Administered 2012-01-05: 4 mL
  Administered 2012-01-05: 5 mL

## 2012-01-05 MED ORDER — ONDANSETRON HCL 4 MG/2ML IJ SOLN: 4.0000 mg | Freq: Four times a day (QID) | INTRAMUSCULAR | Status: DC | PRN

## 2012-01-05 MED ORDER — DIPHENHYDRAMINE HCL 50 MG/ML IJ SOLN: 12.5000 mg | INTRAMUSCULAR | Status: DC | PRN

## 2012-01-05 MED ORDER — FLEET ENEMA 7-19 GM/118ML RE ENEM: 1.0000 | ENEMA | RECTAL | Status: DC | PRN

## 2012-01-05 MED ORDER — PHENYLEPHRINE 40 MCG/ML (10ML) SYRINGE FOR IV PUSH (FOR BLOOD PRESSURE SUPPORT)
80.0000 ug | PREFILLED_SYRINGE | INTRAVENOUS | Status: DC | PRN
  Filled 2012-01-05: qty 5
  Filled 2012-01-05: qty 2

## 2012-01-05 NOTE — Progress Notes (Signed)
Called office to find GBS results.  No GBS test performed at office because pt has a lapse in prenatal care from 29-38 weeks.

## 2012-01-05 NOTE — Anesthesia Procedure Notes (Signed)
Epidural Patient location during procedure: OB Start time: 01/05/2012 8:55 PM  Staffing Anesthesiologist: Gillermo Poch A. Performed by: anesthesiologist   Preanesthetic Checklist Completed: patient identified, site marked, surgical consent, pre-op evaluation, timeout performed, IV checked, risks and benefits discussed and monitors and equipment checked  Epidural Patient position: sitting Prep: site prepped and draped and DuraPrep Patient monitoring: continuous pulse ox and blood pressure Approach: midline Injection technique: LOR air  Needle:  Needle type: Tuohy  Needle gauge: 17 G Needle length: 9 cm Needle insertion depth: 5 cm cm Catheter type: closed end flexible Catheter size: 19 Gauge Catheter at skin depth: 10 cm Test dose: negative and Other  Assessment Events: blood not aspirated, injection not painful, no injection resistance, negative IV test and no paresthesia  Additional Notes Patient identified. Risks and benefits discussed including failed block, incomplete  Pain control, post dural puncture headache, nerve damage, paralysis, blood pressure Changes, nausea, vomiting, reactions to medications-both toxic and allergic and post Partum back pain. All questions were answered. Patient expressed understanding and wished to proceed. Sterile technique was used throughout procedure. Epidural site was Dressed with sterile barrier dressing. No paresthesias, signs of intravascular injection Or signs of intrathecal spread were encountered.  Patient was more comfortable after the epidural was dosed. Please see RN's note for documentation of vital signs and FHR which are stable.

## 2012-01-05 NOTE — H&P (Signed)
Whitney Howell is a 21 y.o. female presenting for SROM/contractions. Maternal Medical History:  Reason for admission: Reason for admission: rupture of membranes and contractions.  Contractions: Frequency: regular.    Fetal activity: Perceived fetal activity is normal.    Prenatal complications: no prenatal complications   OB History    Grav Para Term Preterm Abortions TAB SAB Ect Mult Living   3    2 0 2 0 0 0     Past Medical History  Diagnosis Date  . Shortness of breath   . Gestational diabetes   . Asthma     used inhaler last month   Past Surgical History  Procedure Date  . No past surgeries    Family History: family history includes Asthma in her brother, father, and sister; Cancer in her maternal grandmother and mother; Diabetes in her brother and paternal grandfather; Heart disease in her maternal grandfather; and Hypertension in her maternal grandfather. Social History:  reports that she has never smoked. She does not have any smokeless tobacco history on file. She reports that she does not drink alcohol or use illicit drugs.  Review of Systems  Constitutional: Negative for fever.  Eyes: Negative for blurred vision.  Respiratory: Negative for shortness of breath.   Gastrointestinal: Negative for vomiting.  Skin: Negative for rash.  Neurological: Negative for headaches.    Dilation: 3.5 Effacement (%): 70 Station: -3 Exam by:: Misty Stanley, CNM Blood pressure 135/82, pulse 79, temperature 97.9 F (36.6 C), temperature source Oral, resp. rate 20, height 5\' 7"  (1.702 m), weight 84.55 kg (186 lb 6.4 oz), last menstrual period 04/02/2011, SpO2 100.00%. Maternal Exam:  Uterine Assessment: Contraction frequency is regular.   Abdomen: Fetal presentation: vertex  Introitus: not evaluated.   Cervix: Cervix evaluated by digital exam.     Fetal Exam Fetal Monitor Review: Variability: moderate (6-25 bpm).   Pattern: accelerations present and variable decelerations.     Fetal State Assessment: Category I - tracings are normal.     Physical Exam  Constitutional: She appears well-developed.  HENT:  Head: Normocephalic.  Neck: Neck supple. No thyromegaly present.  Cardiovascular: Normal rate and regular rhythm.   Respiratory: Breath sounds normal.  GI: Soft. Bowel sounds are normal.  Skin: No rash noted.    Prenatal labs: ABO, Rh: O/Positive/-- (04/10 0000) Antibody: Negative (04/10 0000) Rubella: Immune (04/10 0000) RPR: Nonreactive (04/10 0000)  HBsAg: Negative (04/10 0000)  HIV: Non-reactive (04/10 0000)  GBS:     Assessment/Plan: Nullipara at term, active labor, Category 1 FHT. Admit, anticipate an NSVD   Howell,Whitney Dombrosky A 01/05/2012, 12:37 PM

## 2012-01-05 NOTE — Progress Notes (Signed)
Whitney Howell is Howell 21 y.o. G3P0020 at [redacted]w[redacted]d by LMP admitted for rupture of membranes, early labor  Subjective: Uncomfortable  Objective: BP 120/68  Pulse 80  Temp(Src) 97.6 F (36.4 C) (Oral)  Resp 18  Ht 5\' 7"  (1.702 m)  Wt 84.55 kg (186 lb 6.4 oz)  BMI 29.19 kg/m2  SpO2 100%  LMP 04/02/2011      FHT:  FHR: 140 bpm, variability: moderate,  accelerations:  Present,  decelerations:  Absent UC:   irregular, every 2 minutes SVE:   Dilation: 4 Effacement (%): 80 Station: 0 Exam by:: Dr. Tamela Oddi  Labs: Lab Results  Component Value Date   WBC 7.0 01/05/2012   HGB 10.9* 01/05/2012   HCT 33.2* 01/05/2012   MCV 89.0 01/05/2012   PLT 211 01/05/2012    Assessment / Plan: Protracted latent phase  Labor: Monitor MVU; Pitocin for MVU < 200 Preeclampsia:  N/Howell Fetal Wellbeing:  Category I Pain Control:  IV Stadol I/D:  No signs/sxs of chorioamnionitis; GBS status unknown; will prophylax for risk factors Anticipated MOD:  NSVD  JACKSON-MOORE,Whitney Howell 01/05/2012, 8:40 PM

## 2012-01-05 NOTE — MAU Note (Signed)
Pt states clear lof at 0715, feels more pressure on cervix, irregular contractions. Was 3/70/vertex in MAU last pm.

## 2012-01-05 NOTE — MAU Provider Note (Signed)
I have examined this pt for LOF and have confirmed SROM at this time.  Positive pooling on speculum exam, positive ferning.    FHR 135 with moderate variability, positive accels, no decels.  Category I tracing. Ctx mild, irregular Q3-5 minutes

## 2012-01-05 NOTE — Progress Notes (Signed)
Dr Tamela Oddi called to check on pt.  Notified of uc pattern with increased pain.  Notified pt has been walking and is now in the bathtub.  Orders rec for iv pain meds when needed.  Updated on most recent vaginal exam with cervical change.

## 2012-01-05 NOTE — Anesthesia Preprocedure Evaluation (Signed)
Anesthesia Evaluation  Patient identified by MRN, date of birth, ID band Patient awake    Reviewed: Allergy & Precautions, H&P , Patient's Chart, lab work & pertinent test results  Airway Mallampati: III TM Distance: >3 FB Neck ROM: full    Dental No notable dental hx. (+) Teeth Intact   Pulmonary neg pulmonary ROS, shortness of breath, asthma ,  breath sounds clear to auscultation  Pulmonary exam normal       Cardiovascular negative cardio ROS  Rhythm:regular Rate:Normal     Neuro/Psych negative neurological ROS  negative psych ROS   GI/Hepatic negative GI ROS, Neg liver ROS,   Endo/Other  Well Controlled, Gestational  Renal/GU negative Renal ROS  negative genitourinary   Musculoskeletal   Abdominal Normal abdominal exam  (+)   Peds  Hematology negative hematology ROS (+)   Anesthesia Other Findings   Reproductive/Obstetrics (+) Pregnancy                           Anesthesia Physical Anesthesia Plan  ASA: II  Anesthesia Plan: Epidural   Post-op Pain Management:    Induction:   Airway Management Planned:   Additional Equipment:   Intra-op Plan:   Post-operative Plan:   Informed Consent: I have reviewed the patients History and Physical, chart, labs and discussed the procedure including the risks, benefits and alternatives for the proposed anesthesia with the patient or authorized representative who has indicated his/her understanding and acceptance.     Plan Discussed with: Anesthesiologist and Surgeon  Anesthesia Plan Comments:         Anesthesia Quick Evaluation

## 2012-01-06 ENCOUNTER — Encounter (HOSPITAL_COMMUNITY): Payer: Self-pay

## 2012-01-06 LAB — RPR: RPR Ser Ql: NONREACTIVE

## 2012-01-06 MED ORDER — MEDROXYPROGESTERONE ACETATE 150 MG/ML IM SUSP
150.0000 mg | INTRAMUSCULAR | Status: AC | PRN
  Administered 2012-01-07: 150 mg via INTRAMUSCULAR
  Filled 2012-01-06: qty 1

## 2012-01-06 MED ORDER — OXYCODONE-ACETAMINOPHEN 5-325 MG PO TABS
1.0000 | ORAL_TABLET | ORAL | Status: DC | PRN
  Administered 2012-01-06 – 2012-01-07 (×4): 1 via ORAL
  Filled 2012-01-06 (×4): qty 1

## 2012-01-06 MED ORDER — IBUPROFEN 600 MG PO TABS
600.0000 mg | ORAL_TABLET | Freq: Four times a day (QID) | ORAL | Status: DC
  Administered 2012-01-06 – 2012-01-07 (×5): 600 mg via ORAL
  Filled 2012-01-06 (×5): qty 1

## 2012-01-06 MED ORDER — MAGNESIUM HYDROXIDE 400 MG/5ML PO SUSP: 30.0000 mL | ORAL | Status: DC | PRN

## 2012-01-06 MED ORDER — LANOLIN HYDROUS EX OINT: TOPICAL_OINTMENT | CUTANEOUS | Status: DC | PRN

## 2012-01-06 MED ORDER — MEASLES, MUMPS & RUBELLA VAC ~~LOC~~ INJ
0.5000 mL | INJECTION | Freq: Once | SUBCUTANEOUS | Status: DC
  Filled 2012-01-06: qty 0.5

## 2012-01-06 MED ORDER — ZOLPIDEM TARTRATE 5 MG PO TABS: 5.0000 mg | ORAL_TABLET | Freq: Every evening | ORAL | Status: DC | PRN

## 2012-01-06 MED ORDER — SENNOSIDES-DOCUSATE SODIUM 8.6-50 MG PO TABS: 2.0000 | ORAL_TABLET | Freq: Every day | ORAL | Status: DC

## 2012-01-06 MED ORDER — DIBUCAINE 1 % RE OINT: 1.0000 "application " | TOPICAL_OINTMENT | RECTAL | Status: DC | PRN

## 2012-01-06 MED ORDER — BENZOCAINE-MENTHOL 20-0.5 % EX AERO
INHALATION_SPRAY | CUTANEOUS | Status: AC
  Administered 2012-01-06: 1 via TOPICAL
  Filled 2012-01-06: qty 56

## 2012-01-06 MED ORDER — PRENATAL MULTIVITAMIN CH
1.0000 | ORAL_TABLET | Freq: Every day | ORAL | Status: DC
  Administered 2012-01-06 – 2012-01-07 (×2): 1 via ORAL
  Filled 2012-01-06 (×2): qty 1

## 2012-01-06 MED ORDER — WITCH HAZEL-GLYCERIN EX PADS: 1.0000 "application " | MEDICATED_PAD | CUTANEOUS | Status: DC | PRN

## 2012-01-06 MED ORDER — BENZOCAINE-MENTHOL 20-0.5 % EX AERO
1.0000 "application " | INHALATION_SPRAY | CUTANEOUS | Status: DC | PRN
  Administered 2012-01-06: 1 via TOPICAL

## 2012-01-06 MED ORDER — FERROUS SULFATE 325 (65 FE) MG PO TABS
325.0000 mg | ORAL_TABLET | Freq: Two times a day (BID) | ORAL | Status: DC
  Administered 2012-01-06 – 2012-01-07 (×3): 325 mg via ORAL
  Filled 2012-01-06 (×3): qty 1

## 2012-01-06 MED ORDER — ONDANSETRON HCL 4 MG PO TABS: 4.0000 mg | ORAL_TABLET | ORAL | Status: DC | PRN

## 2012-01-06 MED ORDER — DIPHENHYDRAMINE HCL 25 MG PO CAPS: 25.0000 mg | ORAL_CAPSULE | Freq: Four times a day (QID) | ORAL | Status: DC | PRN

## 2012-01-06 MED ORDER — TETANUS-DIPHTH-ACELL PERTUSSIS 5-2.5-18.5 LF-MCG/0.5 IM SUSP
0.5000 mL | Freq: Once | INTRAMUSCULAR | Status: AC
  Administered 2012-01-07: 0.5 mL via INTRAMUSCULAR
  Filled 2012-01-06: qty 0.5

## 2012-01-06 MED ORDER — ONDANSETRON HCL 4 MG/2ML IJ SOLN: 4.0000 mg | INTRAMUSCULAR | Status: DC | PRN

## 2012-01-06 NOTE — Progress Notes (Signed)
UR Chart review completed.  

## 2012-01-06 NOTE — Progress Notes (Signed)
Post Partum Day 1 Subjective: no complaints  Objective: Blood pressure 108/72, pulse 68, temperature 98.6 F (37 C), temperature source Oral, resp. rate 20, height 5\' 7"  (1.702 m), weight 84.55 kg (186 lb 6.4 oz), last menstrual period 04/02/2011, SpO2 100.00%, unknown if currently breastfeeding.  Physical Exam:  General: alert and no distress Lochia: appropriate Uterine Fundus: firm Incision: healing well DVT Evaluation: No evidence of DVT seen on physical exam.   Basename 01/05/12 1055  HGB 10.9*  HCT 33.2*    Assessment/Plan: Plan for discharge tomorrow   LOS: 1 day   Whitney Howell A 01/06/2012, 9:26 AM

## 2012-01-06 NOTE — Anesthesia Postprocedure Evaluation (Signed)
  Anesthesia Post-op Note  Patient: Whitney Howell  Procedure(s) Performed: * No surgery found *  Patient Location: Mother/Baby  Anesthesia Type: Epidural  Level of Consciousness: awake  Airway and Oxygen Therapy: Patient Spontanous Breathing  Post-op Pain: none  Post-op Assessment: Patient's Cardiovascular Status Stable, Respiratory Function Stable, RESPIRATORY FUNCTION UNSTABLE, No signs of Nausea or vomiting, Adequate PO intake, Pain level controlled, No headache, No backache, No residual numbness and No residual motor weakness  Post-op Vital Signs: Reviewed and stable  Complications: No apparent anesthesia complications

## 2012-01-07 MED ORDER — IBUPROFEN 600 MG PO TABS: 600.0000 mg | ORAL_TABLET | Freq: Four times a day (QID) | ORAL | Status: DC

## 2012-01-07 MED ORDER — OXYCODONE-ACETAMINOPHEN 5-325 MG PO TABS: 1.0000 | ORAL_TABLET | ORAL | Status: AC | PRN

## 2012-01-07 NOTE — Progress Notes (Signed)
Post Partum Day 2 Subjective: no complaints  Objective: Blood pressure 109/70, pulse 80, temperature 97.2 F (36.2 C), temperature source Oral, resp. rate 18, height 5\' 7"  (1.702 m), weight 84.55 kg (186 lb 6.4 oz), last menstrual period 04/02/2011, SpO2 100.00%, unknown if currently breastfeeding.  Physical Exam:  General: alert and no distress Lochia: appropriate Uterine Fundus: firm Incision: healing well DVT Evaluation: No evidence of DVT seen on physical exam.   Basename 01/05/12 1055  HGB 10.9*  HCT 33.2*    Assessment/Plan: Discharge home   LOS: 2 days   Djibril Glogowski A 01/07/2012, 8:22 AM

## 2012-01-07 NOTE — Discharge Summary (Signed)
Obstetric Discharge Summary Reason for Admission: onset of labor Prenatal Procedures: ultrasound Intrapartum Procedures: spontaneous vaginal delivery Postpartum Procedures: none Complications-Operative and Postpartum: none Hemoglobin  Date Value Range Status  01/05/2012 10.9* 12.0-15.0 (g/dL) Final     HCT  Date Value Range Status  01/05/2012 33.2* 36.0-46.0 (%) Final    Physical Exam:  General: alert and no distress Lochia: appropriate Uterine Fundus: firm Incision: healing well DVT Evaluation: No evidence of DVT seen on physical exam.  Discharge Diagnoses: Term Pregnancy-delivered  Discharge Information: Date: 01/07/2012 Activity: pelvic rest Diet: routine Medications: PNV, Tylenol #3, Colace and Percocet Condition: stable Instructions: refer to practice specific booklet Discharge to: home Follow-up Information    Follow up with Sua Spadafora A, MD. Schedule an appointment as soon as possible for a visit in 6 weeks.   Contact information:   [address] [place] [address] [phone]          Newborn Data: Live born female  Birth Weight: 6 lb 12 oz (3062 g) APGAR: 8, 9  Home with mother.  Rowen Hur A 01/07/2012, 8:28 AM

## 2012-03-08 ENCOUNTER — Institutional Professional Consult (permissible substitution): Payer: 59 | Admitting: Cardiovascular Disease

## 2014-07-29 ENCOUNTER — Encounter (HOSPITAL_COMMUNITY): Payer: Self-pay

## 2015-06-07 ENCOUNTER — Inpatient Hospital Stay (HOSPITAL_COMMUNITY)
Admission: AD | Admit: 2015-06-07 | Discharge: 2015-06-07 | Disposition: A | Discharge status: Home / Self Care | Payer: Self-pay | Source: Ambulatory Visit | Attending: Obstetrics & Gynecology | Admitting: Obstetrics & Gynecology

## 2015-06-07 ENCOUNTER — Encounter (HOSPITAL_COMMUNITY): Payer: Self-pay | Admitting: *Deleted

## 2015-06-07 DIAGNOSIS — F17210 Nicotine dependence, cigarettes, uncomplicated: Secondary | ICD-10-CM | POA: Insufficient documentation

## 2015-06-07 DIAGNOSIS — B9689 Other specified bacterial agents as the cause of diseases classified elsewhere: Secondary | ICD-10-CM | POA: Insufficient documentation

## 2015-06-07 DIAGNOSIS — N760 Acute vaginitis: Secondary | ICD-10-CM | POA: Insufficient documentation

## 2015-06-07 DIAGNOSIS — A499 Bacterial infection, unspecified: Secondary | ICD-10-CM

## 2015-06-07 DIAGNOSIS — N926 Irregular menstruation, unspecified: Secondary | ICD-10-CM | POA: Insufficient documentation

## 2015-06-07 HISTORY — DX: Trichomoniasis, unspecified: A59.9

## 2015-06-07 LAB — WET PREP, GENITAL
TRICH WET PREP: NONE SEEN
YEAST WET PREP: NONE SEEN

## 2015-06-07 LAB — POCT PREGNANCY, URINE: Preg Test, Ur: NEGATIVE

## 2015-06-07 LAB — HIV ANTIBODY (ROUTINE TESTING W REFLEX): HIV Screen 4th Generation wRfx: NONREACTIVE

## 2015-06-07 MED ORDER — METRONIDAZOLE 500 MG PO TABS
500.0000 mg | ORAL_TABLET | Freq: Two times a day (BID) | ORAL | Status: DC
Start: 2015-06-07 — End: 2016-05-04

## 2015-06-07 NOTE — MAU Provider Note (Signed)
History     CSN: [id number]  Arrival date and time: 06/07/15 4098   First Provider Initiated Contact with Patient 06/07/15 [phone]      Chief Complaint  Patient presents with  . Vaginal Bleeding   HPI   Whitney Howell is a 24 y.o. female [id number], non-pregnant female presenting with heavy vaginal bleeding; she started her menstrual cycle on Monday (this is the normal time for her period). Her period normally lasts 4 days, she continues to have bleeding on the 6th day with blood clots and is concerned.   Patient denies pain at this time.   OB History    Gravida Para Term Preterm AB TAB SAB Ectopic Multiple Living   0 0 1      Past Medical History  Diagnosis Date  . Shortness of breath   . Gestational diabetes   . Asthma     used inhaler last month  . Trichimoniasis     Past Surgical History  Procedure Laterality Date  . No past surgeries    . Induced abortion      Family History  Problem Relation Age of Onset  . Cancer Mother   . Asthma Father   . Asthma Sister   . Diabetes Brother   . Asthma Brother   . Cancer Maternal Grandmother   . Heart disease Maternal Grandfather   . Hypertension Maternal Grandfather   . Diabetes Paternal Grandfather     Social History  Substance Use Topics  . Smoking status: Current Every Day Smoker -- 0.25 packs/day for 5 years    Types: Cigarettes  . Smokeless tobacco: Never Used  . Alcohol Use: Yes     Comment: weekends, 2 or 3    Allergies: No Known Allergies  Prescriptions prior to admission  Medication Sig Dispense Refill Last Dose  . ibuprofen (ADVIL,MOTRIN) 600 MG tablet Take 600 mg by mouth every 6 (six) hours as needed for fever or moderate pain.   Past Week at Unknown time  . ibuprofen (ADVIL,MOTRIN) 600 MG tablet Take 1 tablet (600 mg total) by mouth every 6 (six) hours. (Patient not taking: Reported on 06/07/2015) 30 tablet 5    Results for orders placed or performed during the hospital encounter  of 06/07/15 (from the past 48 hour(s))  Pregnancy, urine POC     Status: None   Collection Time: 06/07/15  8:20 AM  Result Value Ref Range   Preg Test, Ur NEGATIVE NEGATIVE    Comment:        THE SENSITIVITY OF THIS METHODOLOGY IS >24 mIU/mL   Wet prep, genital     Status: Abnormal   Collection Time: 06/07/15  8:40 AM  Result Value Ref Range   Yeast Wet Prep HPF POC NONE SEEN NONE SEEN   Trich, Wet Prep NONE SEEN NONE SEEN   Clue Cells Wet Prep HPF POC MODERATE (A) NONE SEEN   WBC, Wet Prep HPF POC FEW (A) NONE SEEN    Comment: MODERATE BACTERIA SEEN    Review of Systems  Constitutional: Negative for fever and chills.  Gastrointestinal: Negative for abdominal pain.  Neurological: Negative for dizziness.   Physical Exam   Blood pressure 121/57, pulse 68, temperature 98 F (36.7 C), temperature source Oral, resp. rate 16, weight 73.211 kg (161 lb 6.4 oz), last menstrual period 06/02/2015, unknown if currently breastfeeding.  Physical Exam  Constitutional: She is oriented to person, place, and time. She appears well-developed  and well-nourished. No distress.  HENT:  Head: Normocephalic.  Eyes: Pupils are equal, round, and reactive to light.  Neck: Neck supple.  Respiratory: Effort normal.  GI: Soft. She exhibits no distension and no mass. There is no tenderness. There is no rebound and no guarding.  Genitourinary:  Speculum exam: Vagina - Small amount pf dark red blood, no pooling of blood or clots.  Cervix - Scant amount of active bleeding  Bimanual exam: Cervix closed Uterus non tender, normal size Adnexa non tender, no masses bilaterally GC/Chlam, wet prep done Chaperone present for exam.  Neurological: She is alert and oriented to person, place, and time.  Skin: Skin is warm. She is not diaphoretic.  Psychiatric: Her behavior is normal.    MAU Course  Procedures  MDM  GC and HIV pending   Assessment and Plan   A:  1. BV (bacterial vaginosis)   2.  Irregular menstrual bleeding     P:  Discharge home in stable condition RX: Flagyl- no alcohol Return to MAU for emergencies Bleeding precautions Ok to take ibuprofen as directed on the bottle.    Whitney Lope, NP 06/07/2015 8:33 AM

## 2015-06-07 NOTE — MAU Note (Signed)
Started bleeding last Monday, when expected period.  Last 3 days has been passing clots, is not painful, but unsure if this is normal

## 2015-06-09 LAB — GC/CHLAMYDIA PROBE AMP (~~LOC~~) NOT AT ARMC
CHLAMYDIA, DNA PROBE: NEGATIVE
Neisseria Gonorrhea: NEGATIVE

## 2015-07-29 ENCOUNTER — Emergency Department (HOSPITAL_COMMUNITY): Payer: Self-pay

## 2015-07-29 ENCOUNTER — Encounter (HOSPITAL_COMMUNITY): Payer: Self-pay | Admitting: Emergency Medicine

## 2015-07-29 ENCOUNTER — Emergency Department (HOSPITAL_COMMUNITY)
Admission: EM | Admit: 2015-07-29 | Discharge: 2015-07-29 | Disposition: A | Discharge status: Home / Self Care | Payer: Self-pay | Attending: Emergency Medicine | Admitting: Emergency Medicine

## 2015-07-29 DIAGNOSIS — Z720 Tobacco use: Secondary | ICD-10-CM | POA: Insufficient documentation

## 2015-07-29 DIAGNOSIS — Z8619 Personal history of other infectious and parasitic diseases: Secondary | ICD-10-CM | POA: Insufficient documentation

## 2015-07-29 DIAGNOSIS — S6000XA Contusion of unspecified finger without damage to nail, initial encounter: Secondary | ICD-10-CM

## 2015-07-29 DIAGNOSIS — Y9289 Other specified places as the place of occurrence of the external cause: Secondary | ICD-10-CM | POA: Insufficient documentation

## 2015-07-29 DIAGNOSIS — Z8632 Personal history of gestational diabetes: Secondary | ICD-10-CM | POA: Insufficient documentation

## 2015-07-29 DIAGNOSIS — W231XXA Caught, crushed, jammed, or pinched between stationary objects, initial encounter: Secondary | ICD-10-CM | POA: Insufficient documentation

## 2015-07-29 DIAGNOSIS — Y998 Other external cause status: Secondary | ICD-10-CM | POA: Insufficient documentation

## 2015-07-29 DIAGNOSIS — J45909 Unspecified asthma, uncomplicated: Secondary | ICD-10-CM | POA: Insufficient documentation

## 2015-07-29 DIAGNOSIS — Y9389 Activity, other specified: Secondary | ICD-10-CM | POA: Insufficient documentation

## 2015-07-29 DIAGNOSIS — S60031A Contusion of right middle finger without damage to nail, initial encounter: Secondary | ICD-10-CM | POA: Insufficient documentation

## 2015-07-29 MED ORDER — IBUPROFEN 800 MG PO TABS: 800.0000 mg | ORAL_TABLET | Freq: Three times a day (TID) | ORAL | Status: DC

## 2015-07-29 MED ORDER — IBUPROFEN 400 MG PO TABS
600.0000 mg | ORAL_TABLET | Freq: Once | ORAL | Status: AC
  Administered 2015-07-29: 600 mg via ORAL
  Filled 2015-07-29: qty 2

## 2015-07-29 NOTE — ED Notes (Signed)
Pt reports she got right middle finger shut in the car door this am. No deformity noted.l

## 2015-07-29 NOTE — ED Provider Notes (Signed)
CSN: [id number]     Arrival date & time 07/29/15  9562 History  By signing my name below, I, Freida Busman, attest that this documentation has been prepared under the direction and in the presence of non-physician practitioner, Teressa Lower, NP. Electronically Signed: Freida Busman, Scribe. 07/29/2015. 10:16 AM.    Chief Complaint  Patient presents with  . Finger Injury   The history is provided by the patient. No language interpreter was used.   HPI Comments:  Whitney Howell is a 24 y.o. female who presents to the Emergency Department complaining of constant 10/10 pain to her right middle finger following injury this AM. Pt notes she slammed the finger in a car door ~ 1 hour PTA. No alleviating factors noted. Pt has no other complaints or symptoms at this time.   Past Medical History  Diagnosis Date  . Shortness of breath   . Gestational diabetes   . Asthma     used inhaler last month  . Trichimoniasis    Past Surgical History  Procedure Laterality Date  . No past surgeries    . Induced abortion     Family History  Problem Relation Age of Onset  . Cancer Mother   . Asthma Father   . Asthma Sister   . Diabetes Brother   . Asthma Brother   . Cancer Maternal Grandmother   . Heart disease Maternal Grandfather   . Hypertension Maternal Grandfather   . Diabetes Paternal Grandfather    Social History  Substance Use Topics  . Smoking status: Current Every Day Smoker -- 0.25 packs/day for 5 years    Types: Cigarettes  . Smokeless tobacco: Never Used  . Alcohol Use: Yes     Comment: weekends, 2 or 3   OB History    Gravida Para Term Preterm AB TAB SAB Ectopic Multiple Living   0 0 1     Review of Systems  Constitutional: Negative for fever and chills.  Respiratory: Negative for shortness of breath.   Cardiovascular: Negative for chest pain.  Musculoskeletal: Positive for myalgias and arthralgias.       Right middle finger    Allergies  Review of  patient's allergies indicates no known allergies.  Home Medications   Prior to Admission medications   Medication Sig Start Date End Date Taking? Authorizing Provider  ibuprofen (ADVIL,MOTRIN) 600 MG tablet Take 600 mg by mouth every 6 (six) hours as needed for fever or moderate pain.    Historical Provider, MD  metroNIDAZOLE (FLAGYL) 500 MG tablet Take 1 tablet (500 mg total) by mouth 2 (two) times daily. 06/07/15   Harolyn Rutherford Rasch, NP   BP 118/65 mmHg  Pulse 69  Temp(Src) 98 F (36.7 C) (Oral)  Resp 18  Ht  (1.702 m)  Wt 165 lb (74.844 kg)  BMI 25.84 kg/m2  SpO2 100%  LMP 07/26/2015 (Exact Date) Physical Exam  Constitutional: She is oriented to person, place, and time. She appears well-developed and well-nourished. No distress.  HENT:  Head: Normocephalic and atraumatic.  Right Ear: External ear normal.  Left Ear: External ear normal.  Eyes: Conjunctivae are normal.  Cardiovascular: Normal rate.   Pulmonary/Chest: Effort normal and breath sounds normal.  Abdominal: Soft. Bowel sounds are normal. She exhibits no distension.  Musculoskeletal: Normal range of motion.  Tenderness to middle phalanx of the right middle finger. Full rom. Neurovascularly intact  Neurological: She is alert and oriented to person,  place, and time. She exhibits normal muscle tone. Coordination normal.  Skin: Skin is warm and dry.  Psychiatric: She has a normal mood and affect.  Nursing note and vitals reviewed.   ED Course  Procedures   DIAGNOSTIC STUDIES:  Oxygen Saturation is 100% on RA, normal by my interpretation.    COORDINATION OF CARE:  9:49 AM Will order XR of the right hand. Discussed treatment plan with pt at bedside and pt agreed to plan.  Labs Review Labs Reviewed - No data to display  Imaging Review Dg Hand Complete Right  07/29/2015  CLINICAL DATA:  Slammed RIGHT hand in car door 2 hours ago, pain middle finger into third metacarpal EXAM: RIGHT HAND - COMPLETE 3+ VIEW  COMPARISON:  None FINDINGS: Fingers flexed on all views and partially superimposed on lateral view limiting assessment. Osseous mineralization normal. Joint spaces preserved. Mild dorsal soft tissue swelling overlying the MCP joints. No acute fracture, dislocation, or bone destruction. IMPRESSION: No acute osseous abnormalities within limitations of exam as above. Electronically Signed   By: Ulyses SouthwardMark  Boles M.D.   On: 07/29/2015 10:34   I have personally reviewed and evaluated these images as part of my medical decision-making.   EKG Interpretation None      MDM   Final diagnoses:  Finger contusion, initial encounter    No acute bony injury noted. Pt has full rom. Taped finger for comfort  I personally performed the services described in this documentation, which was scribed in my presence. The recorded information has been reviewed and is accurate.    Teressa LowerVrinda Jimmi Sidener, NP 07/29/15 1046  Benjiman CoreNathan Brisha Mccabe, MD 07/30/15 [phone]

## 2015-07-29 NOTE — Discharge Instructions (Signed)

## 2015-12-18 ENCOUNTER — Ambulatory Visit: Payer: Self-pay

## 2016-05-04 ENCOUNTER — Encounter (HOSPITAL_COMMUNITY): Payer: Self-pay | Admitting: *Deleted

## 2016-05-04 ENCOUNTER — Inpatient Hospital Stay (HOSPITAL_COMMUNITY): Payer: Medicaid Other

## 2016-05-04 ENCOUNTER — Inpatient Hospital Stay (HOSPITAL_COMMUNITY)
Admission: AD | Admit: 2016-05-04 | Discharge: 2016-05-04 | Disposition: A | Discharge status: Home / Self Care | Payer: Medicaid Other | Source: Ambulatory Visit | Attending: Obstetrics and Gynecology | Admitting: Obstetrics and Gynecology

## 2016-05-04 DIAGNOSIS — O26891 Other specified pregnancy related conditions, first trimester: Secondary | ICD-10-CM | POA: Diagnosis present

## 2016-05-04 DIAGNOSIS — F17210 Nicotine dependence, cigarettes, uncomplicated: Secondary | ICD-10-CM | POA: Diagnosis not present

## 2016-05-04 DIAGNOSIS — O26899 Other specified pregnancy related conditions, unspecified trimester: Secondary | ICD-10-CM

## 2016-05-04 DIAGNOSIS — Z3A01 Less than 8 weeks gestation of pregnancy: Secondary | ICD-10-CM | POA: Diagnosis not present

## 2016-05-04 DIAGNOSIS — J45909 Unspecified asthma, uncomplicated: Secondary | ICD-10-CM | POA: Insufficient documentation

## 2016-05-04 DIAGNOSIS — O23591 Infection of other part of genital tract in pregnancy, first trimester: Secondary | ICD-10-CM

## 2016-05-04 DIAGNOSIS — A499 Bacterial infection, unspecified: Secondary | ICD-10-CM

## 2016-05-04 DIAGNOSIS — N760 Acute vaginitis: Secondary | ICD-10-CM | POA: Insufficient documentation

## 2016-05-04 DIAGNOSIS — O99332 Smoking (tobacco) complicating pregnancy, second trimester: Secondary | ICD-10-CM | POA: Insufficient documentation

## 2016-05-04 DIAGNOSIS — B9689 Other specified bacterial agents as the cause of diseases classified elsewhere: Secondary | ICD-10-CM

## 2016-05-04 DIAGNOSIS — O99512 Diseases of the respiratory system complicating pregnancy, second trimester: Secondary | ICD-10-CM | POA: Insufficient documentation

## 2016-05-04 DIAGNOSIS — O9989 Other specified diseases and conditions complicating pregnancy, childbirth and the puerperium: Secondary | ICD-10-CM

## 2016-05-04 DIAGNOSIS — R8271 Bacteriuria: Secondary | ICD-10-CM | POA: Diagnosis not present

## 2016-05-04 DIAGNOSIS — R109 Unspecified abdominal pain: Secondary | ICD-10-CM

## 2016-05-04 LAB — URINALYSIS, ROUTINE W REFLEX MICROSCOPIC
BILIRUBIN URINE: NEGATIVE
Glucose, UA: NEGATIVE mg/dL
Hgb urine dipstick: NEGATIVE
KETONES UR: NEGATIVE mg/dL
NITRITE: POSITIVE — AB
PH: 6 (ref 5.0–8.0)
Protein, ur: NEGATIVE mg/dL
Specific Gravity, Urine: 1.025 (ref 1.005–1.030)

## 2016-05-04 LAB — CBC WITH DIFFERENTIAL/PLATELET
Basophils Absolute: 0 10*3/uL (ref 0.0–0.1)
Basophils Relative: 1 %
EOS ABS: 0.1 10*3/uL (ref 0.0–0.7)
EOS PCT: 2 %
HCT: 33 % — ABNORMAL LOW (ref 36.0–46.0)
Hemoglobin: 11.4 g/dL — ABNORMAL LOW (ref 12.0–15.0)
LYMPHS ABS: 1.4 10*3/uL (ref 0.7–4.0)
Lymphocytes Relative: 37 %
MCH: 29 pg (ref 26.0–34.0)
MCHC: 34.5 g/dL (ref 30.0–36.0)
MCV: 84 fL (ref 78.0–100.0)
MONO ABS: 0.2 10*3/uL (ref 0.1–1.0)
Monocytes Relative: 6 %
Neutro Abs: 2.1 10*3/uL (ref 1.7–7.7)
Neutrophils Relative %: 54 %
PLATELETS: 187 10*3/uL (ref 150–400)
RBC: 3.93 MIL/uL (ref 3.87–5.11)
RDW: 14.6 % (ref 11.5–15.5)
WBC: 3.8 10*3/uL — AB (ref 4.0–10.5)

## 2016-05-04 LAB — URINE MICROSCOPIC-ADD ON

## 2016-05-04 LAB — POCT PREGNANCY, URINE: PREG TEST UR: POSITIVE — AB

## 2016-05-04 LAB — WET PREP, GENITAL
Sperm: NONE SEEN
Trich, Wet Prep: NONE SEEN
Yeast Wet Prep HPF POC: NONE SEEN

## 2016-05-04 LAB — HCG, QUANTITATIVE, PREGNANCY: HCG, BETA CHAIN, QUANT, S: 89663 m[IU]/mL — AB (ref <5)

## 2016-05-04 MED ORDER — CEPHALEXIN 500 MG PO CAPS: 500.0000 mg | ORAL_CAPSULE | Freq: Four times a day (QID) | ORAL | 0 refills | Status: DC

## 2016-05-04 MED ORDER — METRONIDAZOLE 0.75 % VA GEL: 1.0000 | Freq: Every day | VAGINAL | 0 refills | Status: AC

## 2016-05-04 NOTE — MAU Note (Signed)
My period has been irreg. Had period in June that only lasted day or so. No bleeding since then 6/21. Dizzy, nausea, can't sleep. Did upt at home and neg

## 2016-05-04 NOTE — MAU Provider Note (Signed)
History     CSN: [id number]  Arrival date and time: 05/04/16 1245   First Provider Initiated Contact with Patient 05/04/16 1341      Chief Complaint  Patient presents with  . Abdominal Cramping   HPI Whitney Howell is a 25 y.o. [phone] at [id number] who presents to MAU today with complaint of lower abdominal cramping x 2 weeks. The patient states this has been consistent and unchanged. She rates pain at 3/10 now. She has been taking Advil with good relief. She denies vaginal bleeding, discharge, UTI symptoms or fever. She has had some nausea without vomiting, diarrhea or constipation. She states LMP 03/17/16, although lighter than normal and negative HPT 3 weeks ago. No bleeding or HPT since then.   OB History    Gravida Para Term Preterm AB Living   4 1 1   2 1    SAB TAB Ectopic Multiple Live Births   1 1 0 0 1      Past Medical History:  Diagnosis Date  . Asthma    used inhaler last month  . Gestational diabetes   . Shortness of breath   . Trichimoniasis     Past Surgical History:  Procedure Laterality Date  . INDUCED ABORTION    . NO PAST SURGERIES      Family History  Problem Relation Age of Onset  . Cancer Mother   . Asthma Father   . Asthma Sister   . Diabetes Brother   . Asthma Brother   . Cancer Maternal Grandmother   . Heart disease Maternal Grandfather   . Hypertension Maternal Grandfather   . Diabetes Paternal Grandfather     Social History  Substance Use Topics  . Smoking status: Current Every Day Smoker    Packs/day: 0.25    Years: 5.00    Types: Cigarettes  . Smokeless tobacco: Never Used  . Alcohol use Yes     Comment: weekends, 2 or 3    Allergies: No Known Allergies  No prescriptions prior to admission.    Review of Systems  Constitutional: Negative for fever and malaise/fatigue.  Gastrointestinal: Positive for abdominal pain and nausea. Negative for constipation, diarrhea and vomiting.  Genitourinary: Negative for dysuria,  frequency and urgency.       Neg - vaginal bleeding, discharge   Physical Exam   Blood pressure 121/68, pulse 76, temperature 97.5 F (36.4 C), resp. rate 18, height 5\' 7"  (1.702 m), weight 160 lb 12.8 oz (72.9 kg), last menstrual period 03/17/2016, unknown if currently breastfeeding.  Physical Exam  Nursing note and vitals reviewed. Constitutional: She is oriented to person, place, and time. She appears well-developed and well-nourished. No distress.  HENT:  Head: Normocephalic and atraumatic.  Cardiovascular: Normal rate.   Respiratory: Effort normal.  GI: Soft. She exhibits no distension and no mass. There is no tenderness. There is no rebound and no guarding.  Genitourinary: There is no rash on the right labia. There is no rash on the left labia. Uterus is enlarged (slightly). Uterus is not tender. Cervix exhibits friability. Cervix exhibits no motion tenderness and no discharge. Right adnexum displays no mass and no tenderness. Left adnexum displays no mass and no tenderness. No bleeding in the vagina. Vaginal discharge (scant thin, white discharge noted without odor) found.  Genitourinary Comments: Cervix: closed, thick, firm  Neurological: She is alert and oriented to person, place, and time.  Skin: Skin is warm and dry. No erythema.  Psychiatric: She has a  normal mood and affect.     Results for orders placed or performed during the hospital encounter of 05/04/16 (from the past 24 hour(s))  Urinalysis, Routine w reflex microscopic (not at [facility])     Status: Abnormal   Collection Time: 05/04/16  1:10 PM  Result Value Ref Range   Color, Urine YELLOW YELLOW   APPearance CLEAR CLEAR   Specific Gravity, Urine 1.025 1.005 - 1.030   pH 6.0 5.0 - 8.0   Glucose, UA NEGATIVE NEGATIVE mg/dL   Hgb urine dipstick NEGATIVE NEGATIVE   Bilirubin Urine NEGATIVE NEGATIVE   Ketones, ur NEGATIVE NEGATIVE mg/dL   Protein, ur NEGATIVE NEGATIVE mg/dL   Nitrite POSITIVE (A) NEGATIVE   Leukocytes,  UA TRACE (A) NEGATIVE  Urine microscopic-add on     Status: Abnormal   Collection Time: 05/04/16  1:10 PM  Result Value Ref Range   Squamous Epithelial / LPF 6-30 (A) NONE SEEN   WBC, UA 0-5 0 - 5 WBC/hpf   RBC / HPF 0-5 0 - 5 RBC/hpf   Bacteria, UA FEW (A) NONE SEEN   Urine-Other MUCOUS PRESENT   Pregnancy, urine POC     Status: Abnormal   Collection Time: 05/04/16  1:29 PM  Result Value Ref Range   Preg Test, Ur POSITIVE (A) NEGATIVE  Wet prep, genital     Status: Abnormal   Collection Time: 05/04/16  1:50 PM  Result Value Ref Range   Yeast Wet Prep HPF POC NONE SEEN NONE SEEN   Trich, Wet Prep NONE SEEN NONE SEEN   Clue Cells Wet Prep HPF POC PRESENT (A) NONE SEEN   WBC, Wet Prep HPF POC FEW (A) NONE SEEN   Sperm NONE SEEN   CBC with Differential/Platelet     Status: Abnormal   Collection Time: 05/04/16  1:53 PM  Result Value Ref Range   WBC 3.8 (L) 4.0 - 10.5 K/uL   RBC 3.93 3.87 - 5.11 MIL/uL   Hemoglobin 11.4 (L) 12.0 - 15.0 g/dL   HCT 56.2 (L) 13.0 - 86.5 %   MCV 84.0 78.0 - 100.0 fL   MCH 29.0 26.0 - 34.0 pg   MCHC 34.5 30.0 - 36.0 g/dL   RDW 78.4 69.6 - 29.5 %   Platelets 187 150 - 400 K/uL   Neutrophils Relative % 54 %   Neutro Abs 2.1 1.7 - 7.7 K/uL   Lymphocytes Relative 37 %   Lymphs Abs 1.4 0.7 - 4.0 K/uL   Monocytes Relative 6 %   Monocytes Absolute 0.2 0.1 - 1.0 K/uL   Eosinophils Relative 2 %   Eosinophils Absolute 0.1 0.0 - 0.7 K/uL   Basophils Relative 1 %   Basophils Absolute 0.0 0.0 - 0.1 K/uL  hCG, quantitative, pregnancy     Status: Abnormal   Collection Time: 05/04/16  1:53 PM  Result Value Ref Range   hCG, Beta Chain, Quant, S 89,663 (H) <5 mIU/mL   US Ob Comp Less 14 Wks  Result Date: 05/04/2016 CLINICAL DATA:  Abdominal pain affecting pregnancy. Patient is 6 weeks and 6 days pregnant based on her last menstrual period. Quantitative beta HCG level is 89,663. EXAM: OBSTETRIC <14 WK [place] AND TRANSVAGINAL OB US TECHNIQUE: Both transabdominal and  transvaginal ultrasound examinations were performed for complete evaluation of the gestation as well as the maternal uterus, adnexal regions, and pelvic cul-de-sac. Transvaginal technique was performed to assess early pregnancy. COMPARISON:  None. FINDINGS: Intrauterine gestational sac: Yes Yolk sac:  Yes  Embryo:  Yes Cardiac Activity: Yes Heart Rate: 166  bpm CRL:  12  mm   7 w   3 d                  [place] EDC: 12/18/2016 Subchorionic hemorrhage:  None visualized. Maternal uterus/adnexae: No uterine masses. Normal cervix. Ovaries are unremarkable. Cystic structure lies adjacent to the right ovary, which may reflect an exophytic ovarian cyst or a paraovarian cyst. Adnexa otherwise unremarkable. No abnormal pelvic free fluid. IMPRESSION: Single live intrauterine pregnancy with a measured gestational age is 7 weeks he 3 days. No pregnancy complication. No maternal abnormality is seen to account for pelvic pain. Electronically Signed   By: Amie Portland M.D.   On: 05/04/2016 15:59   US Ob Transvaginal  Result Date: 05/04/2016 CLINICAL DATA:  Abdominal pain affecting pregnancy. Patient is 6 weeks and 6 days pregnant based on her last menstrual period. Quantitative beta HCG level is 89,663. EXAM: OBSTETRIC <14 WK [place] AND TRANSVAGINAL OB US TECHNIQUE: Both transabdominal and transvaginal ultrasound examinations were performed for complete evaluation of the gestation as well as the maternal uterus, adnexal regions, and pelvic cul-de-sac. Transvaginal technique was performed to assess early pregnancy. COMPARISON:  None. FINDINGS: Intrauterine gestational sac: Yes Yolk sac:  Yes Embryo:  Yes Cardiac Activity: Yes Heart Rate: 166  bpm CRL:  12  mm   7 w   3 d                  [place] EDC: 12/18/2016 Subchorionic hemorrhage:  None visualized. Maternal uterus/adnexae: No uterine masses. Normal cervix. Ovaries are unremarkable. Cystic structure lies adjacent to the right ovary, which may reflect an exophytic ovarian cyst or a  paraovarian cyst. Adnexa otherwise unremarkable. No abnormal pelvic free fluid. IMPRESSION: Single live intrauterine pregnancy with a measured gestational age is 7 weeks he 3 days. No pregnancy complication. No maternal abnormality is seen to account for pelvic pain. Electronically Signed   By: Amie Portland M.D.   On: 05/04/2016 15:59    MAU Course  Procedures None  MDM +UPT UA, wet prep, GC/chlamydia, CBC, quant hCG, HIV, RPR and [place] today to rule out ectopic pregnancy O+ blood type in Epic from previous visit Urine culture ordered   Assessment and Plan  A: SIUP at [redacted]w[redacted]d Asymptomatic bacteruria   Bacterial vaginosis  P: Discharge home Rx for Metrogel and Keflex given to patient  Advised Tylenol PRN for pain  First trimester precautions discussed Patient advised to follow-up with OB provider of choice. List of area OB providers given with pregnancy confirmation letter Patient may return to MAU as needed or if her condition were to change or worsen  Marny Lowenstein, PA-C  05/04/2016, 4:28 PM

## 2016-05-04 NOTE — Progress Notes (Signed)
Julie Wenzel PA in earlier to discuss test results and d/c plan. Written and verbal d/c instructions given and understanding voiced 

## 2016-05-04 NOTE — Discharge Instructions (Signed)
Bacterial Vaginosis °Bacterial vaginosis is an infection of the vagina. It happens when too many germs (bacteria) grow in the vagina. Having this infection puts you at risk for getting other infections from sex. Treating this infection can help lower your risk for other infections, such as:  °· Chlamydia. °· Gonorrhea. °· HIV. °· Herpes. °HOME CARE °· Take your medicine as told by your doctor. °· Finish your medicine even if you start to feel better. °· Tell your sex partner that you have an infection. They should see their doctor for treatment. °· During treatment: °¨ Avoid sex or use condoms correctly. °¨ Do not douche. °¨ Do not drink alcohol unless your doctor tells you it is ok. °¨ Do not breastfeed unless your doctor tells you it is ok. °GET HELP IF: °· You are not getting better after 3 days of treatment. °· You have more grey fluid (discharge) coming from your vagina than before. °· You have more pain than before. °· You have a fever. °MAKE SURE YOU:  °· Understand these instructions. °· Will watch your condition. °· Will get help right away if you are not doing well or get worse. °  °This information is not intended to replace advice given to you by your health care provider. Make sure you discuss any questions you have with your health care provider. °  °Document Released: 06/22/2008 Document Revised: 10/04/2014 Document Reviewed: 04/25/2013 °Elsevier Interactive Patient Education ©2016 Elsevier Inc. °First Trimester of Pregnancy °The first trimester of pregnancy is from week 1 until the end of week 12 (months 1 through 3). During this time, your baby will begin to develop inside you. At 6-8 weeks, the eyes and face are formed, and the heartbeat can be seen on ultrasound. At the end of 12 weeks, all the baby's organs are formed. Prenatal care is all the medical care you receive before the birth of your baby. Make sure you get good prenatal care and follow all of your doctor's instructions. °HOME CARE    °Medicines °· Take medicine only as told by your doctor. Some medicines are safe and some are not during pregnancy. °· Take your prenatal vitamins as told by your doctor. °· Take medicine that helps you poop (stool softener) as needed if your doctor says it is okay. °Diet °· Eat regular, healthy meals. °· Your doctor will tell you the amount of weight gain that is right for you. °· Avoid raw meat and uncooked cheese. °· If you feel sick to your stomach (nauseous) or throw up (vomit): °¨ Eat 4 or 5 small meals a day instead of 3 large meals. °¨ Try eating a few soda crackers. °¨ Drink liquids between meals instead of during meals. °· If you have a hard time pooping (constipation): °¨ Eat high-fiber foods like fresh vegetables, fruit, and whole grains. °¨ Drink enough fluids to keep your pee (urine) clear or pale yellow. °Activity and Exercise °· Exercise only as told by your doctor. Stop exercising if you have cramps or pain in your lower belly (abdomen) or low back. °· Try to avoid standing for long periods of time. Move your legs often if you must stand in one place for a long time. °· Avoid heavy lifting. °· Wear low-heeled shoes. Sit and stand up straight. °· You can have sex unless your doctor tells you not to. °Relief of Pain or Discomfort °· Wear a good support bra if your breasts are sore. °· Take warm water baths (sitz baths)   baths) to soothe pain or discomfort caused by hemorrhoids. Use hemorrhoid cream if your doctor says it is okay.  Rest with your legs raised if you have leg cramps or low back pain.  Wear support hose if you have puffy, bulging veins (varicose veins) in your legs. Raise (elevate) your feet for 15 minutes, 3-4 times a day. Limit salt in your diet. Prenatal Care  Schedule your prenatal visits by the twelfth week of pregnancy.  Write down your questions. Take them to your prenatal visits.  Keep all your prenatal visits as told by your doctor. Safety  Wear your seat belt at all times  when driving.  Make a list of emergency phone numbers. The list should include numbers for family, friends, the hospital, and police and fire departments. General Tips  Ask your doctor for a referral to a local prenatal class. Begin classes no later than at the start of month 6 of your pregnancy.  Ask for help if you need counseling or help with nutrition. Your doctor can give you advice or tell you where to go for help.  Do not use hot tubs, steam rooms, or saunas.  Do not douche or use tampons or scented sanitary pads.  Do not cross your legs for long periods of time.  Avoid litter boxes and soil used by cats.  Avoid all smoking, herbs, and alcohol. Avoid drugs not approved by your doctor.  Do not use any tobacco products, including cigarettes, chewing tobacco, and electronic cigarettes. If you need help quitting, ask your doctor. You may get counseling or other support to help you quit.  Visit your dentist. At home, brush your teeth with a soft toothbrush. Be gentle when you floss. GET HELP IF:  You are dizzy.  You have mild cramps or pressure in your lower belly.  You have a nagging pain in your belly area.  You continue to feel sick to your stomach, throw up, or have watery poop (diarrhea).  You have a bad smelling fluid coming from your vagina.  You have pain with peeing (urination).  You have increased puffiness (swelling) in your face, hands, legs, or ankles. GET HELP RIGHT AWAY IF:   You have a fever.  You are leaking fluid from your vagina.  You have spotting or bleeding from your vagina.  You have very bad belly cramping or pain.  You gain or lose weight rapidly.  You throw up blood. It may look like coffee grounds.  You are around people who have [place] measles, fifth disease, or chickenpox.  You have a very bad headache.  You have shortness of breath.  You have any kind of trauma, such as from a fall or a car accident.   This information is not  intended to replace advice given to you by your health care provider. Make sure you discuss any questions you have with your health care provider.   Document Released: 03/01/2008 Document Revised: 10/04/2014 Document Reviewed: 07/24/2013 Elsevier Interactive Patient Education 2016 Elsevier Inc. Pregnancy and Urinary Tract Infection A urinary tract infection (UTI) is a bacterial infection of the urinary tract. Infection of the urinary tract can include the ureters, kidneys (pyelonephritis), bladder (cystitis), and urethra (urethritis). All pregnant women should be screened for bacteria in the urinary tract. Identifying and treating a UTI will decrease the risk of preterm labor and developing more serious infections in both the mother and baby. CAUSES Bacteria germs cause almost all UTIs.  RISK FACTORS Many factors can increase  your chances of getting a UTI during pregnancy. These include:  Having a short urethra.  Poor toilet and hygiene habits.  Sexual intercourse.  Blockage of urine along the urinary tract.  Problems with the pelvic muscles or nerves.  Diabetes.  Obesity.  Bladder problems after having several children.  Previous history of UTI. SIGNS AND SYMPTOMS   Pain, burning, or a stinging feeling when urinating.  Suddenly feeling the need to urinate right away (urgency).  Loss of bladder control (urinary incontinence).  Frequent urination, more than is common with pregnancy.  Lower abdominal or back discomfort.  Cloudy urine.  Blood in the urine (hematuria).  Fever. When the kidneys are infected, the symptoms may be:  Back pain.  Flank pain on the right side more so than the left.  Fever.  Chills.  Nausea.  Vomiting. DIAGNOSIS  A urinary tract infection is usually diagnosed through urine tests. Additional tests and procedures are sometimes done. These may include:  Ultrasound exam of the kidneys, ureters, bladder, and urethra.  Looking in the  bladder with a lighted tube (cystoscopy). TREATMENT Typically, UTIs can be treated with antibiotic medicines.  HOME CARE INSTRUCTIONS   Only take over-the-counter or prescription medicines as directed by your health care provider. If you were prescribed antibiotics, take them as directed. Finish them even if you start to feel better.  Drink enough fluids to keep your urine clear or pale yellow.  Do not have sexual intercourse until the infection is gone and your health care provider says it is okay.  Make sure you are tested for UTIs throughout your pregnancy. These infections often come back. Preventing a UTI in the Future  Practice good toilet habits. Always wipe from front to back. Use the tissue only once.  Do not hold your urine. Empty your bladder as soon as possible when the urge comes.  Do not douche or use deodorant sprays.  Wash with soap and warm water around the genital area and the anus.  Empty your bladder before and after sexual intercourse.  Wear underwear with a cotton crotch.  Avoid caffeine and carbonated drinks. They can irritate the bladder.  Drink cranberry juice or take cranberry pills. This may decrease the risk of getting a UTI.  Do not drink alcohol.  Keep all your appointments and tests as scheduled. SEEK MEDICAL CARE IF:   Your symptoms get worse.  You are still having fevers 2 or more days after treatment begins.  You have a rash.  You feel that you are having problems with medicines prescribed.  You have abnormal vaginal discharge. SEEK IMMEDIATE MEDICAL CARE IF:   You have back or flank pain.  You have chills.  You have blood in your urine.  You have nausea and vomiting.  You have contractions of your uterus.  You have a gush of fluid from the vagina. MAKE SURE YOU:  Understand these instructions.   Will watch your condition.   Will get help right away if you are not doing well or get worse.    This information is  not intended to replace advice given to you by your health care provider. Make sure you discuss any questions you have with your health care provider.   Document Released: 01/08/2011 Document Revised: 07/04/2013 Document Reviewed: 04/12/2013 Elsevier Interactive Patient Education [employer]

## 2016-05-05 ENCOUNTER — Other Ambulatory Visit: Payer: Self-pay | Admitting: Medical

## 2016-05-05 LAB — CULTURE, OB URINE: CULTURE: NO GROWTH

## 2016-05-05 LAB — RPR: RPR: NONREACTIVE

## 2016-05-05 LAB — HIV ANTIBODY (ROUTINE TESTING W REFLEX): HIV Screen 4th Generation wRfx: NONREACTIVE

## 2016-05-05 LAB — GC/CHLAMYDIA PROBE AMP (~~LOC~~) NOT AT ARMC
Chlamydia: POSITIVE — AB
NEISSERIA GONORRHEA: NEGATIVE

## 2016-05-06 ENCOUNTER — Telehealth (HOSPITAL_COMMUNITY): Payer: Self-pay | Admitting: *Deleted

## 2016-05-06 DIAGNOSIS — O98811 Other maternal infectious and parasitic diseases complicating pregnancy, first trimester: Principal | ICD-10-CM

## 2016-05-06 DIAGNOSIS — A749 Chlamydial infection, unspecified: Secondary | ICD-10-CM

## 2016-05-06 MED ORDER — AZITHROMYCIN 500 MG PO TABS: ORAL_TABLET | ORAL | 0 refills | Status: DC

## 2016-05-06 NOTE — Telephone Encounter (Signed)
Telephone call to patient regarding positive chlamydia culture, patient notified.  Rx routed to pharmacy per protocol.  Instructed patient to notify her partner for treatment and to abstain from sex for seven days post treatment.  Report faxed to health department.  

## 2016-05-27 ENCOUNTER — Inpatient Hospital Stay (HOSPITAL_COMMUNITY)
Admission: AD | Admit: 2016-05-27 | Discharge: 2016-05-27 | Disposition: A | Discharge status: Home / Self Care | Payer: Medicaid Other | Source: Ambulatory Visit | Attending: Obstetrics and Gynecology | Admitting: Obstetrics and Gynecology

## 2016-05-27 ENCOUNTER — Encounter (HOSPITAL_COMMUNITY): Payer: Self-pay | Admitting: *Deleted

## 2016-05-27 DIAGNOSIS — O26891 Other specified pregnancy related conditions, first trimester: Secondary | ICD-10-CM | POA: Insufficient documentation

## 2016-05-27 DIAGNOSIS — Z87891 Personal history of nicotine dependence: Secondary | ICD-10-CM | POA: Insufficient documentation

## 2016-05-27 DIAGNOSIS — O99511 Diseases of the respiratory system complicating pregnancy, first trimester: Secondary | ICD-10-CM | POA: Insufficient documentation

## 2016-05-27 DIAGNOSIS — B9689 Other specified bacterial agents as the cause of diseases classified elsewhere: Secondary | ICD-10-CM | POA: Diagnosis not present

## 2016-05-27 DIAGNOSIS — R109 Unspecified abdominal pain: Secondary | ICD-10-CM | POA: Insufficient documentation

## 2016-05-27 DIAGNOSIS — B373 Candidiasis of vulva and vagina: Secondary | ICD-10-CM | POA: Insufficient documentation

## 2016-05-27 DIAGNOSIS — B3731 Acute candidiasis of vulva and vagina: Secondary | ICD-10-CM

## 2016-05-27 DIAGNOSIS — J45909 Unspecified asthma, uncomplicated: Secondary | ICD-10-CM | POA: Diagnosis not present

## 2016-05-27 DIAGNOSIS — O26899 Other specified pregnancy related conditions, unspecified trimester: Secondary | ICD-10-CM

## 2016-05-27 DIAGNOSIS — N760 Acute vaginitis: Secondary | ICD-10-CM | POA: Diagnosis not present

## 2016-05-27 DIAGNOSIS — O23591 Infection of other part of genital tract in pregnancy, first trimester: Secondary | ICD-10-CM | POA: Diagnosis not present

## 2016-05-27 DIAGNOSIS — Z3A10 10 weeks gestation of pregnancy: Secondary | ICD-10-CM | POA: Insufficient documentation

## 2016-05-27 DIAGNOSIS — O9989 Other specified diseases and conditions complicating pregnancy, childbirth and the puerperium: Secondary | ICD-10-CM | POA: Diagnosis not present

## 2016-05-27 LAB — URINALYSIS, ROUTINE W REFLEX MICROSCOPIC
Bilirubin Urine: NEGATIVE
GLUCOSE, UA: NEGATIVE mg/dL
HGB URINE DIPSTICK: NEGATIVE
Ketones, ur: NEGATIVE mg/dL
Nitrite: NEGATIVE
PH: 6 (ref 5.0–8.0)
PROTEIN: NEGATIVE mg/dL

## 2016-05-27 LAB — WET PREP, GENITAL
SPERM: NONE SEEN
TRICH WET PREP: NONE SEEN

## 2016-05-27 LAB — URINE MICROSCOPIC-ADD ON: RBC / HPF: NONE SEEN RBC/hpf (ref 0–5)

## 2016-05-27 LAB — CBC
HCT: 35.5 % — ABNORMAL LOW (ref 36.0–46.0)
Hemoglobin: 12.2 g/dL (ref 12.0–15.0)
MCH: 28.9 pg (ref 26.0–34.0)
MCHC: 34.4 g/dL (ref 30.0–36.0)
MCV: 84.1 fL (ref 78.0–100.0)
PLATELETS: 214 10*3/uL (ref 150–400)
RBC: 4.22 MIL/uL (ref 3.87–5.11)
RDW: 14.5 % (ref 11.5–15.5)
WBC: 4.9 10*3/uL (ref 4.0–10.5)

## 2016-05-27 LAB — GLUCOSE, CAPILLARY: Glucose-Capillary: 76 mg/dL (ref 65–99)

## 2016-05-27 MED ORDER — TERCONAZOLE 0.8 % VA CREA: 1.0000 | TOPICAL_CREAM | Freq: Every day | VAGINAL | 0 refills | Status: AC

## 2016-05-27 MED ORDER — METRONIDAZOLE 500 MG PO TABS: 500.0000 mg | ORAL_TABLET | Freq: Two times a day (BID) | ORAL | 0 refills | Status: DC

## 2016-05-27 NOTE — Discharge Instructions (Signed)
Abdominal Pain During Pregnancy Belly (abdominal) pain is common during pregnancy. Most of the time, it is not a serious problem. Other times, it can be a sign that something is wrong with the pregnancy. Always tell your doctor if you have belly pain. HOME CARE Monitor your belly pain for any changes. The following actions may help you feel better:  Do not have sex (intercourse) or put anything in your vagina until you feel better.  Rest until your pain stops.  Drink clear fluids if you feel sick to your stomach (nauseous). Do not eat solid food until you feel better.  Only take medicine as told by your doctor.  Keep all doctor visits as told. GET HELP RIGHT AWAY IF:   You are bleeding, leaking fluid, or pieces of tissue come out of your vagina.  You have more pain or cramping.  You keep throwing up (vomiting).  You have pain when you pee (urinate) or have blood in your pee.  You have a fever.  You do not feel your baby moving as much.  You feel very weak or feel like passing out.  You have trouble breathing, with or without belly pain.  You have a very bad headache and belly pain.  You have fluid leaking from your vagina and belly pain.  You keep having watery poop (diarrhea).  Your belly pain does not go away after resting, or the pain gets worse. MAKE SURE YOU:   Understand these instructions.  Will watch your condition.  Will get help right away if you are not doing well or get worse.   This information is not intended to replace advice given to you by your health care provider. Make sure you discuss any questions you have with your health care provider.   Document Released: 09/01/2009 Document Revised: 05/16/2013 Document Reviewed: 04/12/2013 Elsevier Interactive Patient Education 2016 Elsevier Inc.    Monilial Vaginitis Vaginitis in a soreness, swelling and redness (inflammation) of the vagina and vulva. Monilial vaginitis is not a sexually transmitted  infection. CAUSES  Yeast vaginitis is caused by yeast (candida) that is normally found in your vagina. With a yeast infection, the candida has overgrown in number to a point that upsets the chemical balance. SYMPTOMS   White, thick vaginal discharge.  Swelling, itching, redness and irritation of the vagina and possibly the lips of the vagina (vulva).  Burning or painful urination.  Painful intercourse. DIAGNOSIS  Things that may contribute to monilial vaginitis are:  Postmenopausal and virginal states.  Pregnancy.  Infections.  Being tired, sick or stressed, especially if you had monilial vaginitis in the past.  Diabetes. Good control will help lower the chance.  Birth control pills.  Tight fitting garments.  Using bubble bath, feminine sprays, douches or deodorant tampons.  Taking certain medications that kill germs (antibiotics).  Sporadic recurrence can occur if you become ill. TREATMENT  Your caregiver will give you medication.  There are several kinds of anti monilial vaginal creams and suppositories specific for monilial vaginitis. For recurrent yeast infections, use a suppository or cream in the vagina 2 times a week, or as directed.  Anti-monilial or steroid cream for the itching or irritation of the vulva may also be used. Get your caregiver's permission.  Painting the vagina with methylene blue solution may help if the monilial cream does not work.  Eating yogurt may help prevent monilial vaginitis. HOME CARE INSTRUCTIONS   Finish all medication as prescribed.  Do not have sex until treatment  is completed or after your caregiver tells you it is okay.  Take warm sitz baths.  Do not douche.  Do not use tampons, especially scented ones.  Wear cotton underwear.  Avoid tight pants and panty hose.  Tell your sexual partner that you have a yeast infection. They should go to their caregiver if they have symptoms such as mild rash or itching.  Your  sexual partner should be treated as well if your infection is difficult to eliminate.  Practice safer sex. Use condoms.  Some vaginal medications cause latex condoms to fail. Vaginal medications that harm condoms are:  Cleocin cream.  Butoconazole (Femstat).  Terconazole (Terazol) vaginal suppository.  Miconazole (Monistat) (may be purchased over the counter). SEEK MEDICAL CARE IF:   You have a temperature by mouth above 102 F (38.9 C).  The infection is getting worse after 2 days of treatment.  The infection is not getting better after 3 days of treatment.  You develop blisters in or around your vagina.  You develop vaginal bleeding, and it is not your menstrual period.  You have pain when you urinate.  You develop intestinal problems.  You have pain with sexual intercourse.   This information is not intended to replace advice given to you by your health care provider. Make sure you discuss any questions you have with your health care provider.   Document Released: 06/23/2005 Document Revised: 12/06/2011 Document Reviewed: 03/17/2015 Elsevier Interactive Patient Education 2016 Elsevier Inc.   Bacterial Vaginosis Bacterial vaginosis is an infection of the vagina. It happens when too many germs (bacteria) grow in the vagina. Having this infection puts you at risk for getting other infections from sex. Treating this infection can help lower your risk for other infections, such as:   Chlamydia.  Gonorrhea.  HIV.  Herpes. HOME CARE  Take your medicine as told by your doctor.  Finish your medicine even if you start to feel better.  During treatment:  Avoid sex or use condoms correctly.  Do not douche.  Do not drink alcohol unless your doctor tells you it is ok.  Do not breastfeed unless your doctor tells you it is ok. GET HELP IF:  You are not getting better after 3 days of treatment.  You have more grey fluid (discharge) coming from your vagina than  before.  You have more pain than before.  You have a fever. MAKE SURE YOU:   Understand these instructions.  Will watch your condition.  Will get help right away if you are not doing well or get worse.   This information is not intended to replace advice given to you by your health care provider. Make sure you discuss any questions you have with your health care provider.   Document Released: 06/22/2008 Document Revised: 10/04/2014 Document Reviewed: 04/25/2013 Elsevier Interactive Patient Education [employer]

## 2016-05-27 NOTE — MAU Note (Signed)
Pt C/O lower abd cramping since last night, became worse at work today, denies bleeding.  Pain radiates down to perineum & back to rectum.  Standing & walking makes the pain worse.

## 2016-05-27 NOTE — MAU Provider Note (Signed)
History     CSN: [id number]  Arrival date and time: 05/27/16 1218   First Provider Initiated Contact with Patient 05/27/16 1516      Chief Complaint  Patient presents with  . Abdominal Pain   HPI  Whitney Howell is a 25 y.o. [id number] at 1370w1d who presents with abdominal pain. Reports lower abdominal pain that is sharp and constant. Pain began last night and worsened today while at work. States pain feels like braxton hicks. Aggravated by walking and standing at work. Rates pain 9/10. Has not treated.  Denies n/v/d, constipation, dysuria, fever/chills, vaginal bleeding, or vaginal discharge. Last BM was this morning and was normal for her.  Was treated for chlamydia on 8/10 & has not had intercourse since then.    OB History    Gravida Para Term Preterm AB Living   4 1 1   2 1    SAB TAB Ectopic Multiple Live Births   1 1 0 0 1      Past Medical History:  Diagnosis Date  . Asthma    used inhaler last month  . Gestational diabetes   . Shortness of breath   . Trichimoniasis     Past Surgical History:  Procedure Laterality Date  . INDUCED ABORTION      Family History  Problem Relation Age of Onset  . Cancer Mother   . Asthma Father   . Asthma Sister   . Diabetes Brother   . Asthma Brother   . Cancer Maternal Grandmother   . Heart disease Maternal Grandfather   . Hypertension Maternal Grandfather   . Diabetes Paternal Grandfather     Social History  Substance Use Topics  . Smoking status: Former Smoker    Packs/day: 0.25    Years: 5.00    Types: Cigarettes    Quit date: 05/20/2016  . Smokeless tobacco: Never Used  . Alcohol use Yes     Comment: March 26 2016    Allergies: No Known Allergies  Prescriptions Prior to Admission  Medication Sig Dispense Refill Last Dose  . azithromycin (ZITHROMAX) 500 MG tablet Take two tablets by mouth once. 2 tablet 0 Past Month at Unknown time  . cephALEXin (KEFLEX) 500 MG capsule Take 1 capsule (500 mg total) by mouth  4 (four) times daily. 28 capsule 0 Past Week at Unknown time    Review of Systems  Constitutional: Negative for chills and fever.  Gastrointestinal: Positive for abdominal pain. Negative for constipation, diarrhea, nausea and vomiting.  Genitourinary: Negative.    Physical Exam   Blood pressure 125/76, pulse 86, temperature 98.1 F (36.7 C), temperature source Oral, resp. rate 16, height 5\' 6"  (1.676 m), weight 163 lb (73.9 kg), last menstrual period 03/17/2016, unknown if currently breastfeeding.  Physical Exam  Nursing note and vitals reviewed. Constitutional: She is oriented to person, place, and time. She appears well-developed and well-nourished. No distress.  HENT:  Head: Normocephalic and atraumatic.  Eyes: Conjunctivae are normal. Right eye exhibits no discharge. Left eye exhibits no discharge. No scleral icterus.  Neck: Normal range of motion.  Cardiovascular: Normal rate, regular rhythm and normal heart sounds.   No murmur heard. Respiratory: Effort normal and breath sounds normal. No respiratory distress. She has no wheezes.  GI: Soft. Bowel sounds are normal. She exhibits no distension. There is no tenderness. There is no rebound.  Genitourinary: Uterus normal. Cervix exhibits no motion tenderness and no friability. Right adnexum displays no mass and no tenderness. Left  adnexum displays no mass and no tenderness. No bleeding in the vagina. Vaginal discharge (small amount of thin white discharge) found.  Genitourinary Comments: Cervix closed  Neurological: She is alert and oriented to person, place, and time.  Skin: Skin is warm and dry. She is not diaphoretic.  Psychiatric: She has a normal mood and affect. Her behavior is normal. Judgment and thought content normal.    MAU Course  Procedures Results for orders placed or performed during the hospital encounter of 05/27/16 (from the past 24 hour(s))  Urinalysis, Routine w reflex microscopic (not at [facility])     Status:  Abnormal   Collection Time: 05/27/16 12:36 PM  Result Value Ref Range   Color, Urine YELLOW YELLOW   APPearance HAZY (A) CLEAR   Specific Gravity, Urine >1.030 (H) 1.005 - 1.030   pH 6.0 5.0 - 8.0   Glucose, UA NEGATIVE NEGATIVE mg/dL   Hgb urine dipstick NEGATIVE NEGATIVE   Bilirubin Urine NEGATIVE NEGATIVE   Ketones, ur NEGATIVE NEGATIVE mg/dL   Protein, ur NEGATIVE NEGATIVE mg/dL   Nitrite NEGATIVE NEGATIVE   Leukocytes, UA SMALL (A) NEGATIVE  Urine microscopic-add on     Status: Abnormal   Collection Time: 05/27/16 12:36 PM  Result Value Ref Range   Squamous Epithelial / LPF 6-30 (A) NONE SEEN   WBC, UA 6-30 0 - 5 WBC/hpf   RBC / HPF NONE SEEN 0 - 5 RBC/hpf   Bacteria, UA FEW (A) NONE SEEN   Urine-Other MUCOUS PRESENT   Glucose, capillary     Status: None   Collection Time: 05/27/16  3:28 PM  Result Value Ref Range   Glucose-Capillary 76 65 - 99 mg/dL  Wet prep, genital     Status: Abnormal   Collection Time: 05/27/16  3:50 PM  Result Value Ref Range   Yeast Wet Prep HPF POC PRESENT (A) NONE SEEN   Trich, Wet Prep NONE SEEN NONE SEEN   Clue Cells Wet Prep HPF POC PRESENT (A) NONE SEEN   WBC, Wet Prep HPF POC MODERATE (A) NONE SEEN   Sperm NONE SEEN   CBC     Status: Abnormal   Collection Time: 05/27/16  4:09 PM  Result Value Ref Range   WBC 4.9 4.0 - 10.5 K/uL   RBC 4.22 3.87 - 5.11 MIL/uL   Hemoglobin 12.2 12.0 - 15.0 g/dL   HCT 16.1 (L) 09.6 - 04.5 %   MCV 84.1 78.0 - 100.0 fL   MCH 28.9 26.0 - 34.0 pg   MCHC 34.4 30.0 - 36.0 g/dL   RDW 40.9 81.1 - 91.4 %   Platelets 214 150 - 400 K/uL    MDM FHT 161 by doppler CBC, Gc/CT, wet prep Cervix closed VSS, NAD Assessment and Plan  A: 1. Abdominal pain during pregnancy   2. BV (bacterial vaginosis)   3. Vaginal yeast infection     P: Discharge home Rx flagyl & terazol GC/CT & urine culture pending Discussed reasons to return to MAU  Judeth Horn 05/27/2016, 3:16 PM

## 2016-05-28 LAB — GC/CHLAMYDIA PROBE AMP (~~LOC~~) NOT AT ARMC
CHLAMYDIA, DNA PROBE: NEGATIVE
NEISSERIA GONORRHEA: NEGATIVE

## 2016-05-28 LAB — CULTURE, OB URINE: Culture: NO GROWTH

## 2016-06-15 ENCOUNTER — Encounter: Payer: Self-pay | Admitting: Obstetrics and Gynecology

## 2016-06-30 ENCOUNTER — Encounter: Payer: Self-pay | Admitting: Obstetrics & Gynecology

## 2016-06-30 ENCOUNTER — Ambulatory Visit (INDEPENDENT_AMBULATORY_CARE_PROVIDER_SITE_OTHER): Payer: Medicaid Other | Admitting: Obstetrics & Gynecology

## 2016-06-30 ENCOUNTER — Other Ambulatory Visit (HOSPITAL_COMMUNITY)
Admission: RE | Admit: 2016-06-30 | Discharge: 2016-06-30 | Disposition: A | Discharge status: Home / Self Care | Payer: Medicaid Other | Source: Ambulatory Visit | Attending: Obstetrics & Gynecology | Admitting: Obstetrics & Gynecology

## 2016-06-30 DIAGNOSIS — Z113 Encounter for screening for infections with a predominantly sexual mode of transmission: Secondary | ICD-10-CM

## 2016-06-30 DIAGNOSIS — Z124 Encounter for screening for malignant neoplasm of cervix: Secondary | ICD-10-CM | POA: Diagnosis not present

## 2016-06-30 DIAGNOSIS — Z3490 Encounter for supervision of normal pregnancy, unspecified, unspecified trimester: Secondary | ICD-10-CM | POA: Insufficient documentation

## 2016-06-30 DIAGNOSIS — Z01411 Encounter for gynecological examination (general) (routine) with abnormal findings: Secondary | ICD-10-CM | POA: Insufficient documentation

## 2016-06-30 DIAGNOSIS — Z3492 Encounter for supervision of normal pregnancy, unspecified, second trimester: Secondary | ICD-10-CM

## 2016-06-30 MED ORDER — PRENATAL VITAMINS 0.8 MG PO TABS: 1.0000 | ORAL_TABLET | Freq: Every day | ORAL | 12 refills | Status: AC

## 2016-06-30 NOTE — Progress Notes (Signed)
  Subjective:    Whitney MerlesSharnelle D Howell is a [id number] being seen today for her first obstetrical visit.  Her obstetrical history is significant for low risk. Patient does intend to breast feed. Pregnancy history fully reviewed.  Patient reports abdominal pain after a fall.  There were no vitals filed for this visit.  HISTORY: OB History  Gravida Para Term Preterm AB Living  4 1 1   2 1   SAB TAB Ectopic Multiple Live Births  1 1 0 0 1    # Outcome Date GA Lbr Len/2nd Weight Sex Delivery Anes PTL Lv  4 Current           3 Term 01/05/12 3570w5d 16:08 / 00:36 6 lb 12 oz (3.062 kg) M Vag-Spont EPI  LIV     Birth Comments: wnl  2 TAB           1 SAB              Past Medical History:  Diagnosis Date  . Asthma    used inhaler last month  . Gestational diabetes   . Trichimoniasis    Past Surgical History:  Procedure Laterality Date  . INDUCED ABORTION     Family History  Problem Relation Age of Onset  . Cancer Mother   . Asthma Father   . Asthma Sister   . Diabetes Brother   . Asthma Brother   . Cancer Maternal Grandmother   . Heart disease Maternal Grandfather   . Hypertension Maternal Grandfather   . Diabetes Paternal Grandfather      Exam    Uterus:     Pelvic Exam:    Perineum: No Hemorrhoids   Vulva: normal   Vagina:  normal mucosa   pH:     Cervix: no lesions   Adnexa: normal adnexa   Bony Pelvis: average  System: Breast:  normal appearance, no masses or tenderness   Skin: normal coloration and turgor, no rashes    Neurologic: oriented, normal mood   Extremities: normal strength, tone, and muscle mass   HEENT PERRLA   Mouth/Teeth mucous membranes moist, pharynx normal without lesions and dental hygiene good   Neck supple   Cardiovascular: regular rate and rhythm, no murmurs or gallops   Respiratory:  appears well, vitals normal, no respiratory distress, acyanotic, normal RR, neck free of mass or lymphadenopathy, chest clear, no wheezing, crepitations,  rhonchi, normal symmetric air entry   Abdomen: soft, non-tender; bowel sounds normal; no masses,  no organomegaly   Urinary: urethral meatus normal      Assessment:    Pregnancy: [id number] Patient Active Problem List   Diagnosis Date Noted  . Encounter for supervision of low-risk pregnancy, antepartum 06/30/2016  . History of miscarriage 06/16/2011        Plan:     Initial labs drawn. Prenatal vitamins. Problem list reviewed and updated. Genetic Screening discussed Quad Screen: ordered.  Ultrasound discussed; fetal survey: ordered.  Follow up in 4 weeks. 50% of 30 min visit spent on counseling and coordination of care.     Sayre Witherington 06/30/2016

## 2016-06-30 NOTE — Progress Notes (Signed)
Patient stated that she was running on Sunday 06-27-16 and fell, since then she has been feeling irritability and pain in her abdomen that radiates to her back. Patient also states that she has had nausea since the incident.

## 2016-07-02 LAB — CULTURE, OB URINE

## 2016-07-02 LAB — CYTOLOGY - PAP

## 2016-07-02 LAB — URINE CULTURE, OB REFLEX: ORGANISM ID, BACTERIA: NO GROWTH

## 2016-07-09 LAB — AFP, QUAD SCREEN
DIA MOM VALUE: 0.83
DIA Value (EIA): 147.24 pg/mL
DSR (BY AGE) 1 IN: 998
DSR (Second Trimester) 1 IN: 615
Gestational Age: 15.6 WEEKS
MATERNAL AGE AT EDD: 25.7 a
MSAFP MOM: 0.68
MSAFP: 21.8 ng/mL
MSHCG MOM: 2.21
MSHCG: 91842 m[IU]/mL
Osb Risk: 10000
T18 (By Age): 1:3887 {titer}
TEST RESULTS AFP: NEGATIVE
UE3 VALUE: 0.41 ng/mL
WEIGHT: 163 [lb_av]
uE3 Mom: 0.59

## 2016-07-09 LAB — PRENATAL PROFILE I(LABCORP)
Antibody Screen: NEGATIVE
BASOS ABS: 0 10*3/uL (ref 0.0–0.2)
Basos: 0 %
EOS (ABSOLUTE): 0.2 10*3/uL (ref 0.0–0.4)
Eos: 3 %
HEMOGLOBIN: 11 g/dL — AB (ref 11.1–15.9)
HEP B S AG: NEGATIVE
Hematocrit: 33.3 % — ABNORMAL LOW (ref 34.0–46.6)
IMMATURE GRANS (ABS): 0 10*3/uL (ref 0.0–0.1)
IMMATURE GRANULOCYTES: 0 %
LYMPHS ABS: 1.1 10*3/uL (ref 0.7–3.1)
LYMPHS: 19 %
MCH: 28.5 pg (ref 26.6–33.0)
MCHC: 33 g/dL (ref 31.5–35.7)
MCV: 86 fL (ref 79–97)
MONOS ABS: 0.2 10*3/uL (ref 0.1–0.9)
Monocytes: 4 %
NEUTROS PCT: 74 %
Neutrophils Absolute: 4.3 10*3/uL (ref 1.4–7.0)
PLATELETS: 222 10*3/uL (ref 150–379)
RBC: 3.86 x10E6/uL (ref 3.77–5.28)
RDW: 15.5 % — ABNORMAL HIGH (ref 12.3–15.4)
RPR Ser Ql: NONREACTIVE
Rh Factor: POSITIVE
Rubella Antibodies, IGG: 1.26 index (ref >0.99)
WBC: 5.8 10*3/uL (ref 3.4–10.8)

## 2016-07-09 LAB — TOXASSURE SELECT 13 (MW), URINE

## 2016-07-26 ENCOUNTER — Encounter: Payer: Medicaid Other | Admitting: Certified Nurse Midwife

## 2016-07-29 ENCOUNTER — Encounter: Payer: Medicaid Other | Admitting: Obstetrics & Gynecology

## 2016-07-29 ENCOUNTER — Other Ambulatory Visit: Payer: Medicaid Other

## 2016-08-10 ENCOUNTER — Encounter: Payer: Medicaid Other | Admitting: Obstetrics & Gynecology

## 2016-08-10 ENCOUNTER — Ambulatory Visit (INDEPENDENT_AMBULATORY_CARE_PROVIDER_SITE_OTHER): Payer: Medicaid Other

## 2016-08-10 DIAGNOSIS — Z3492 Encounter for supervision of normal pregnancy, unspecified, second trimester: Secondary | ICD-10-CM

## 2016-08-10 DIAGNOSIS — Z3490 Encounter for supervision of normal pregnancy, unspecified, unspecified trimester: Secondary | ICD-10-CM

## 2017-02-11 IMAGING — DX DG HAND COMPLETE 3+V*R*
3 series · 3 of 3 positions shown · non-contrast
Comparison: None

CLINICAL DATA: Slammed RIGHT hand in car door 2 hours ago, pain
middle finger into third metacarpal

EXAM:
RIGHT HAND - COMPLETE 3+ VIEW

[hand pa]
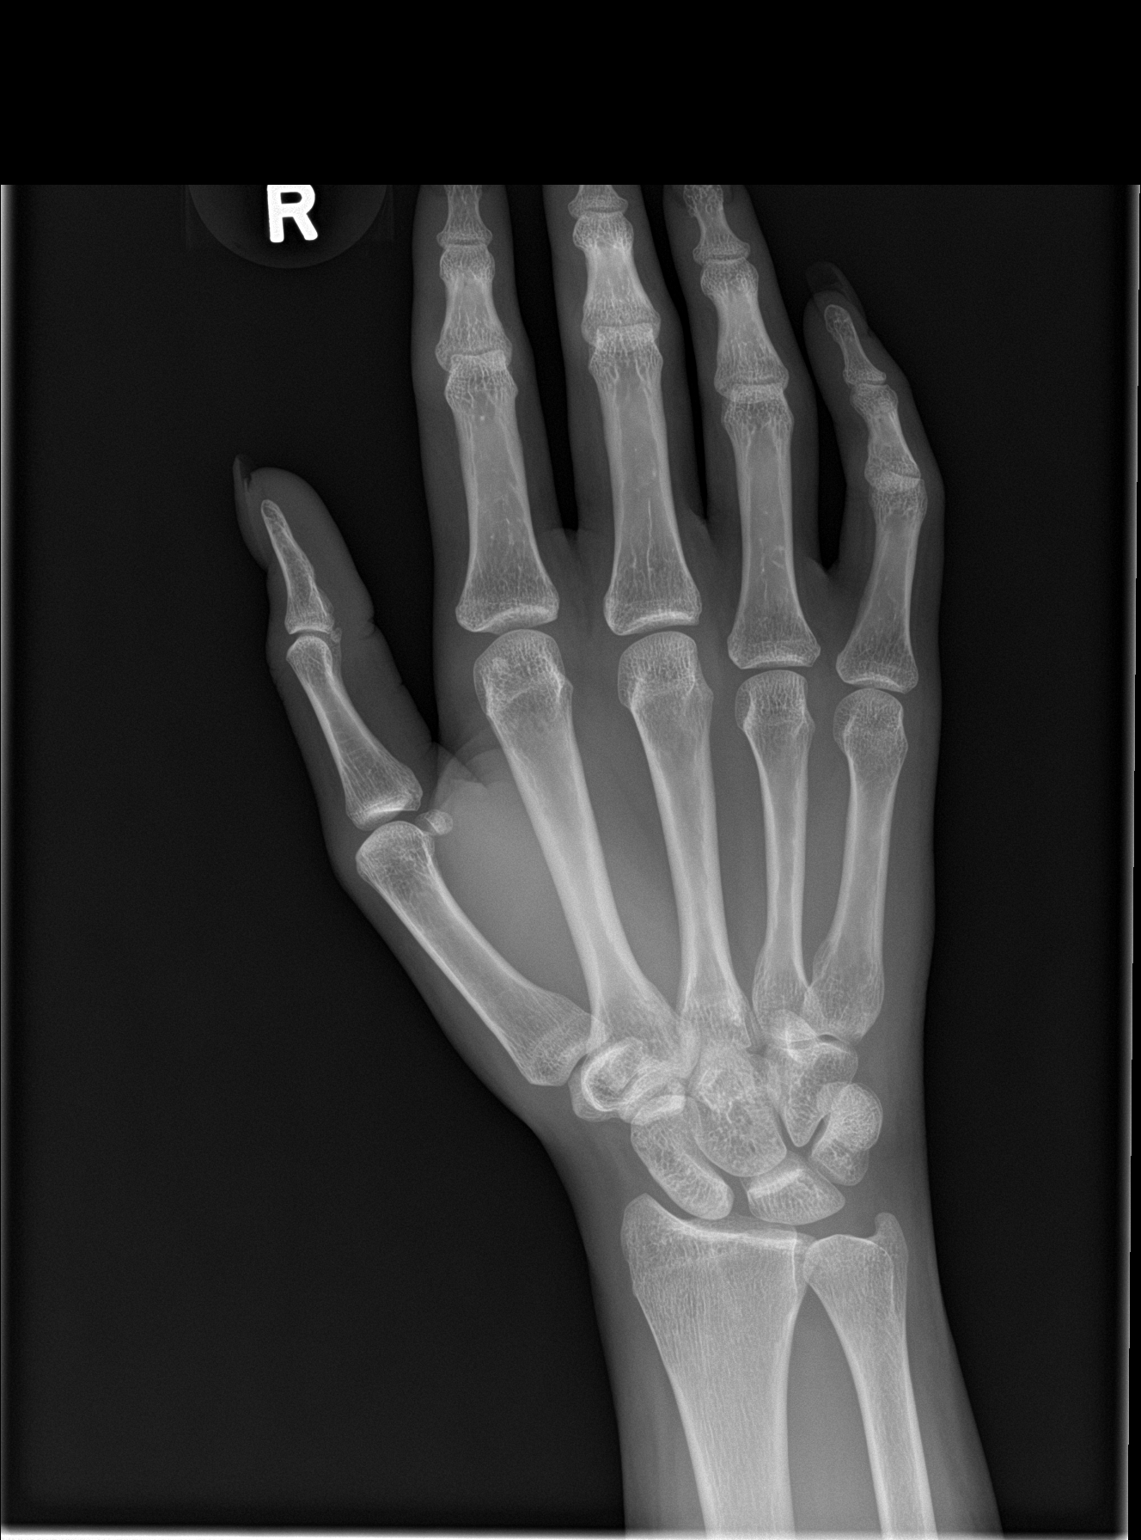

[hand obl]
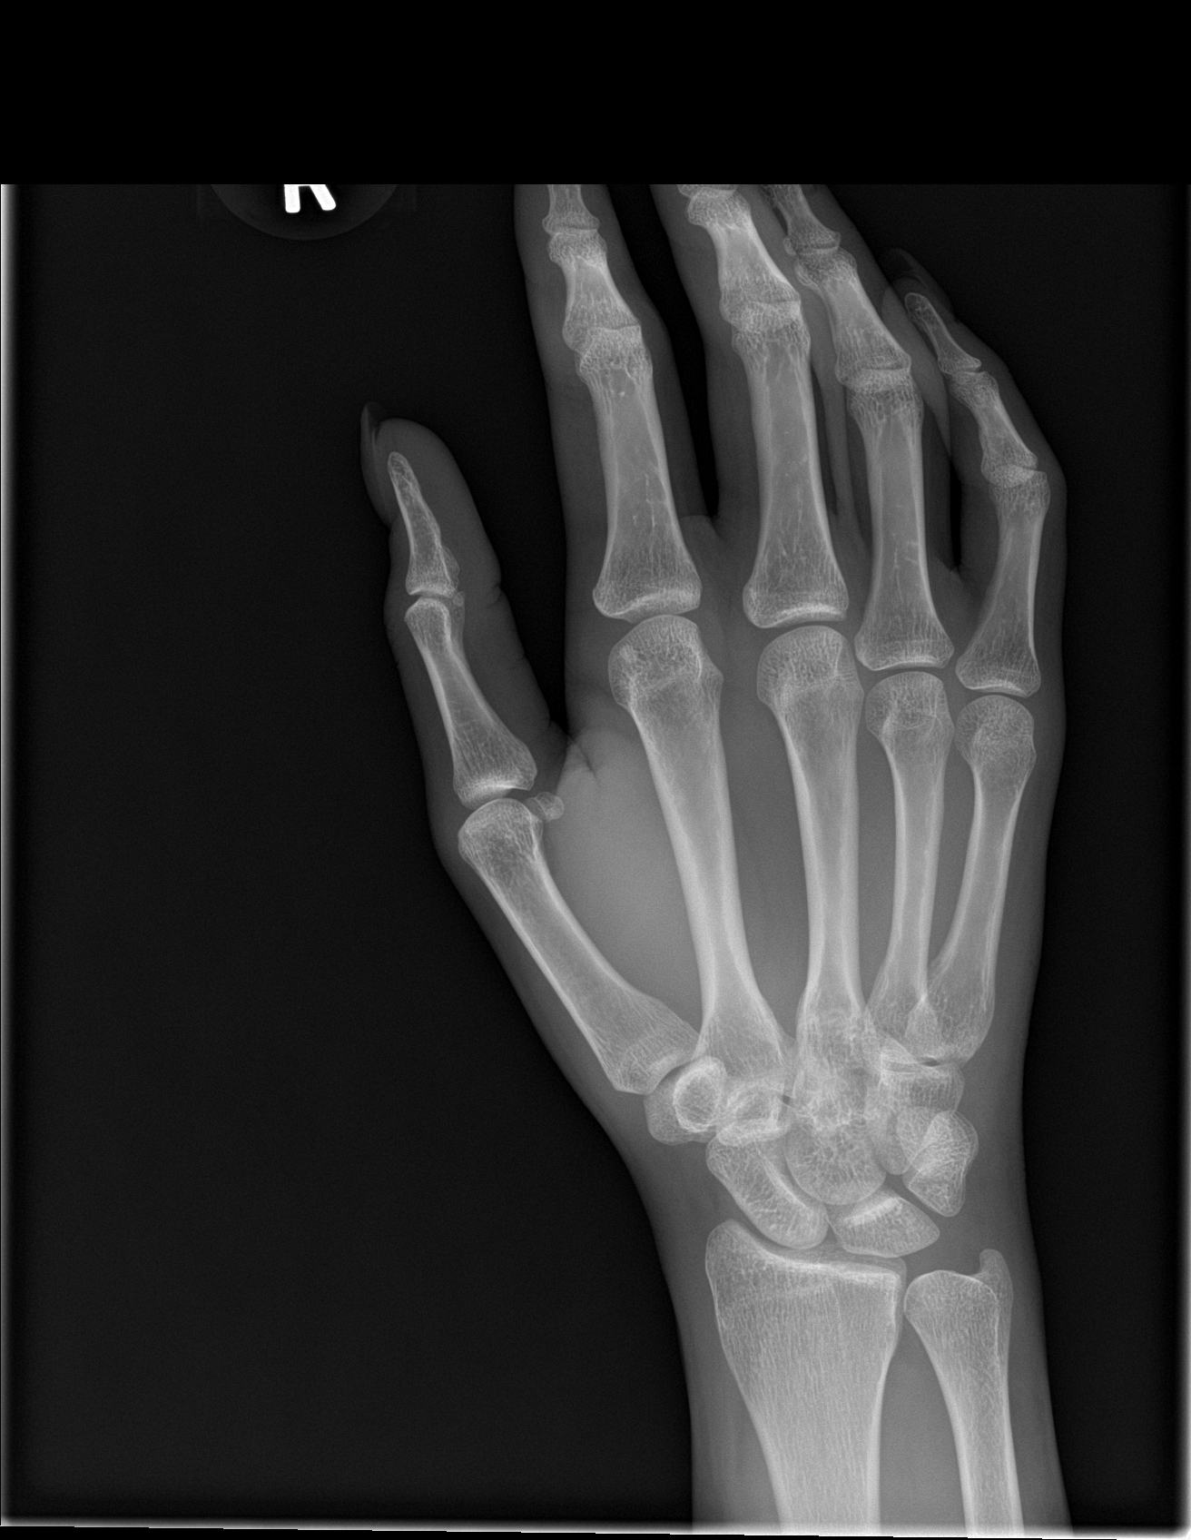

[hand lat]
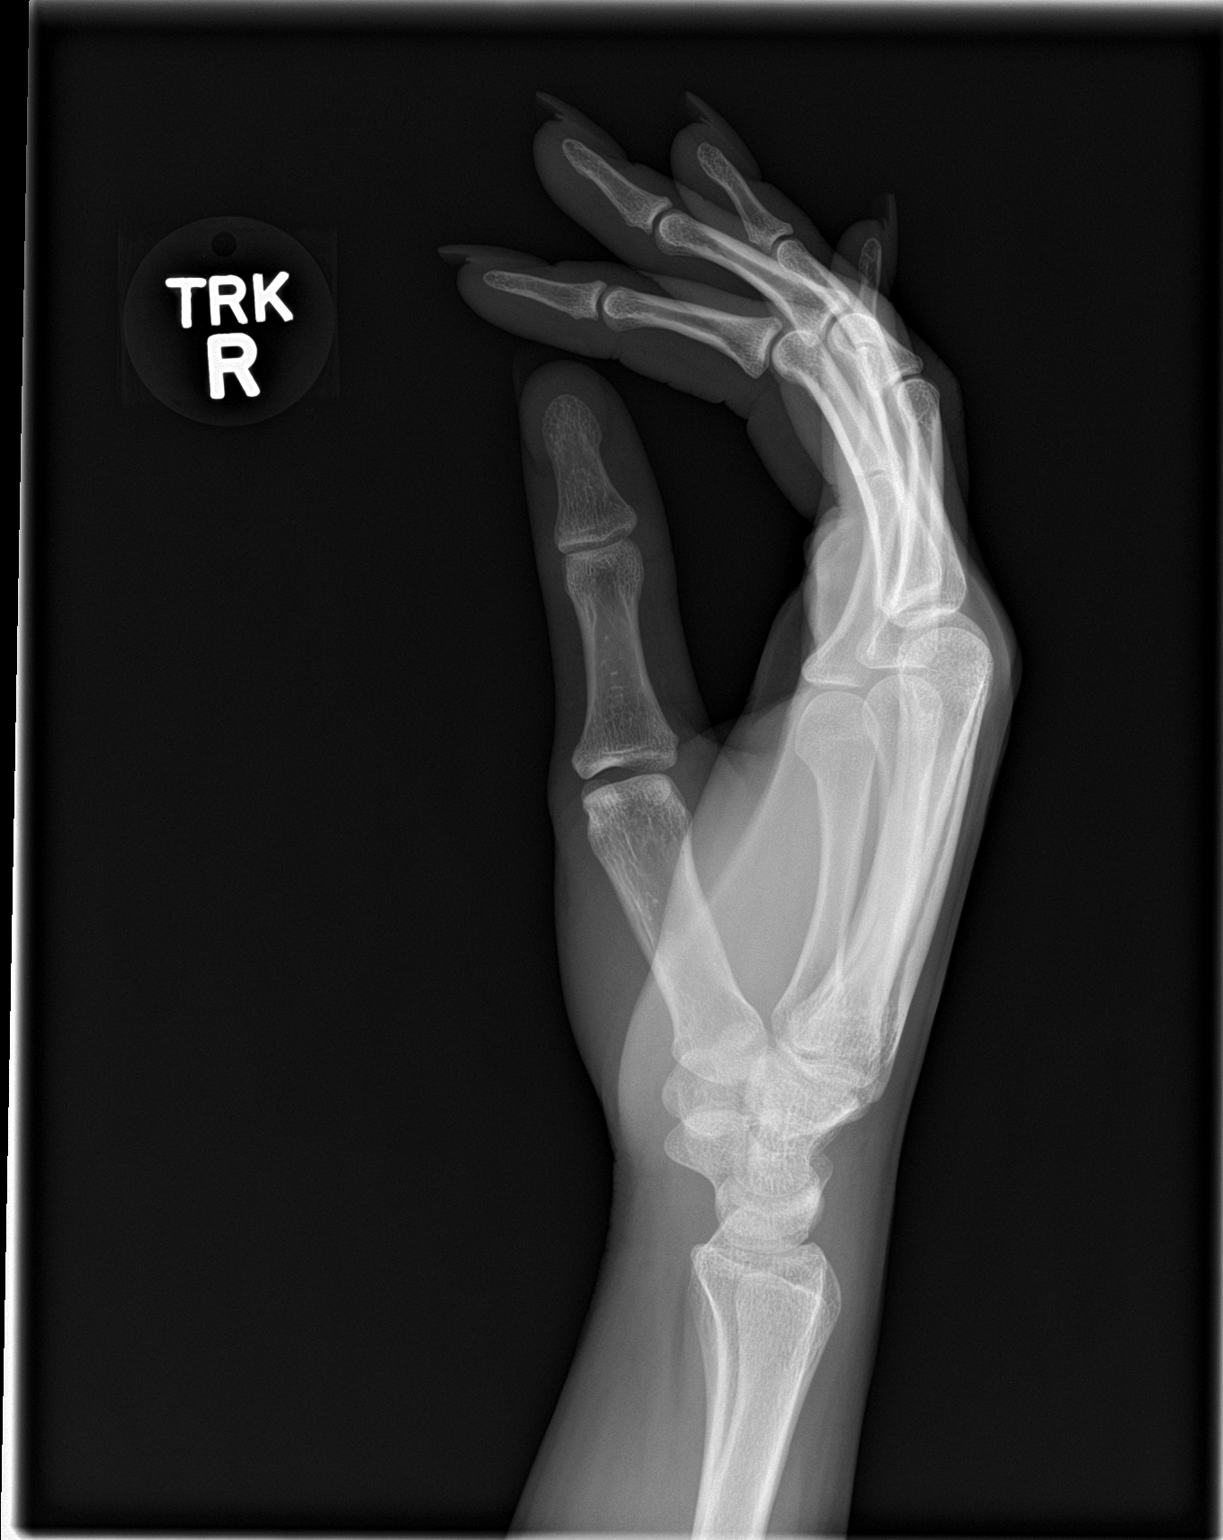

[3 of 3 positions shown; findings below may reference images not displayed]

FINDINGS: Fingers flexed on all views and partially superimposed on lateral
view limiting assessment.

Osseous mineralization normal.

Joint spaces preserved.

Mild dorsal soft tissue swelling overlying the MCP joints.

No acute fracture, dislocation, or bone destruction.
IMPRESSION: No acute osseous abnormalities within limitations of exam as above.

## 2017-03-09 ENCOUNTER — Encounter (HOSPITAL_COMMUNITY): Payer: Self-pay

## 2017-11-18 IMAGING — US US OB TRANSVAGINAL
1 series · 15 of 28 positions shown · non-contrast
Comparison: None.

CLINICAL DATA: Abdominal pain affecting pregnancy. Patient is 6
weeks and 6 days pregnant based on her last menstrual period.
Quantitative beta HCG level is [DATE].

EXAM:
OBSTETRIC <14 WK US AND TRANSVAGINAL OB US
TECHNIQUE: Both transabdominal and transvaginal ultrasound examinations were
performed for complete evaluation of the gestation as well as the
maternal uterus, adnexal regions, and pelvic cul-de-sac.
Transvaginal technique was performed to assess early pregnancy.

[Series 1: us ob transvaginal · 53 acquisitions, 15 frames shown]
[im 1/53]
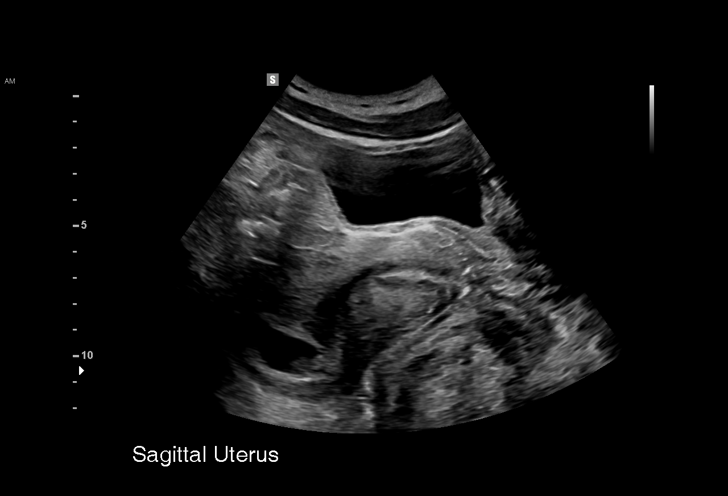
[im 4/53]
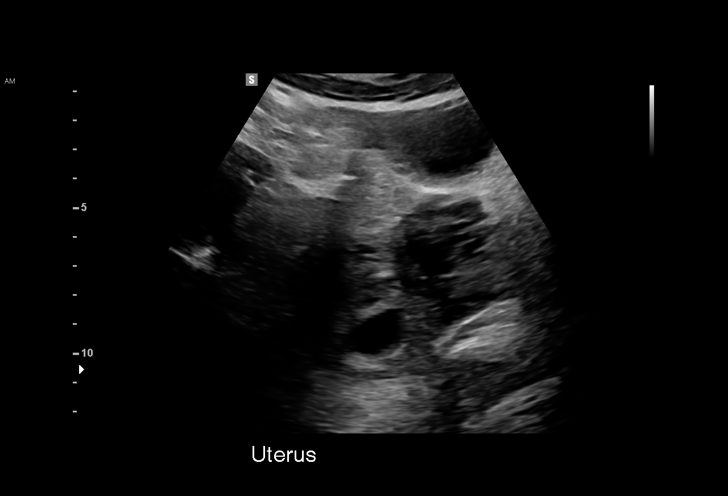
[im 8/53]
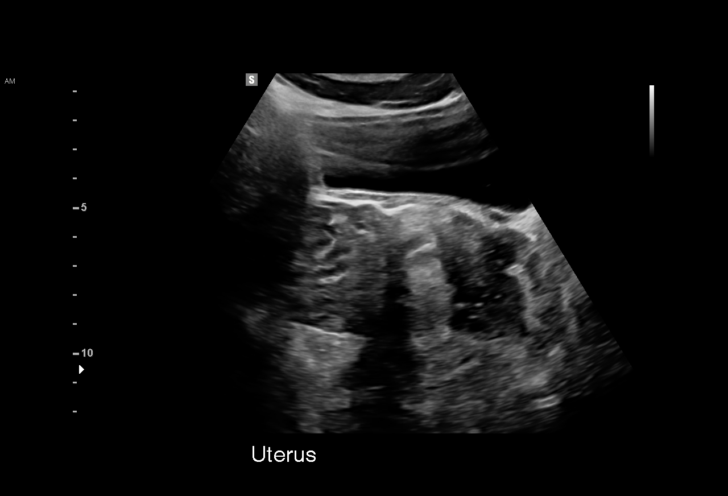
[im 12/53]
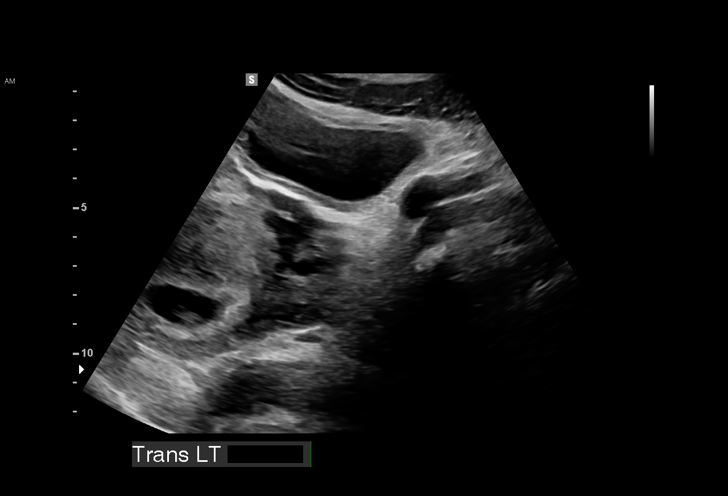
[im 16/53]
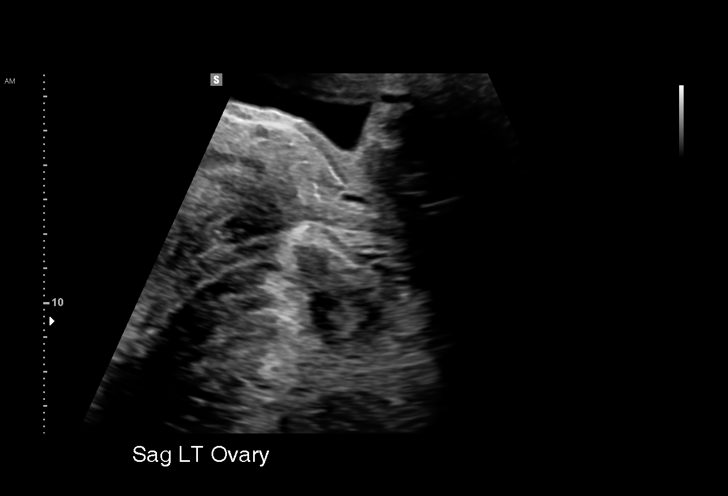
[im 20/53]
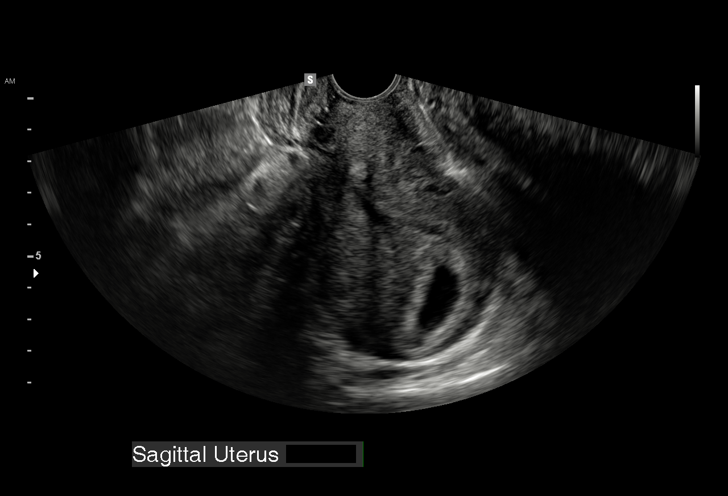
[im 24/53]
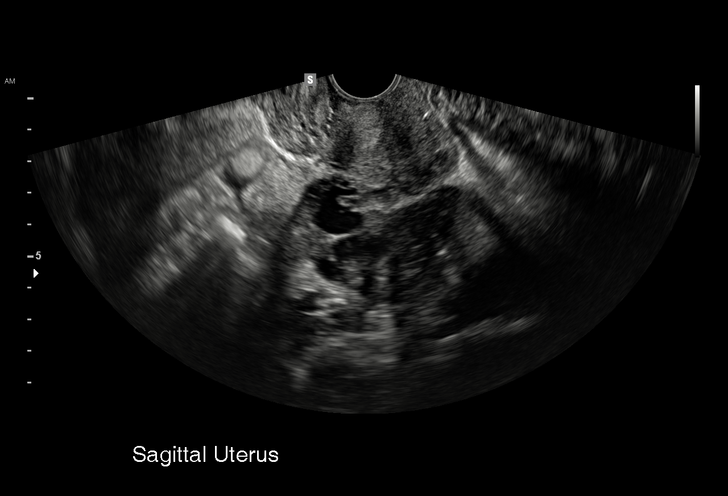
[im 27/53]
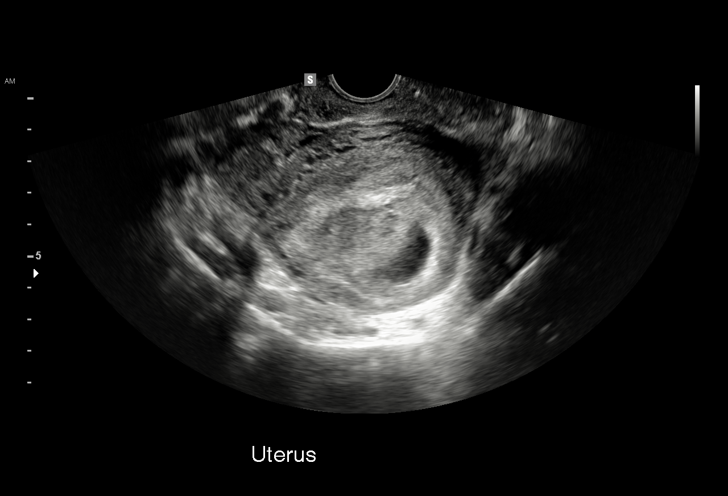
[im 29/53]
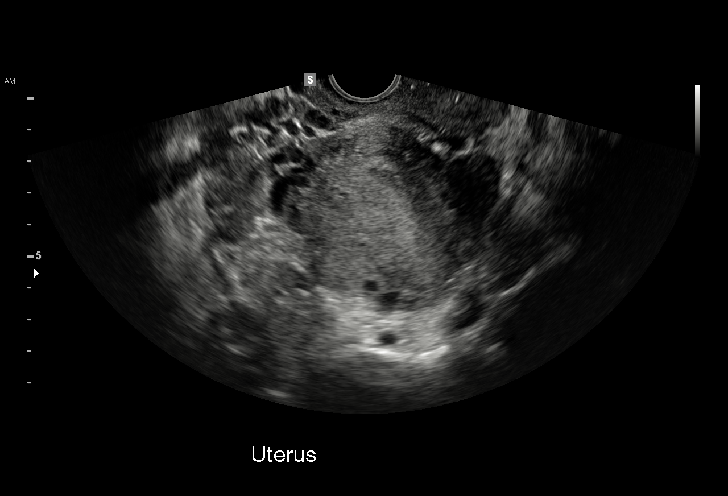
[im 33/53]
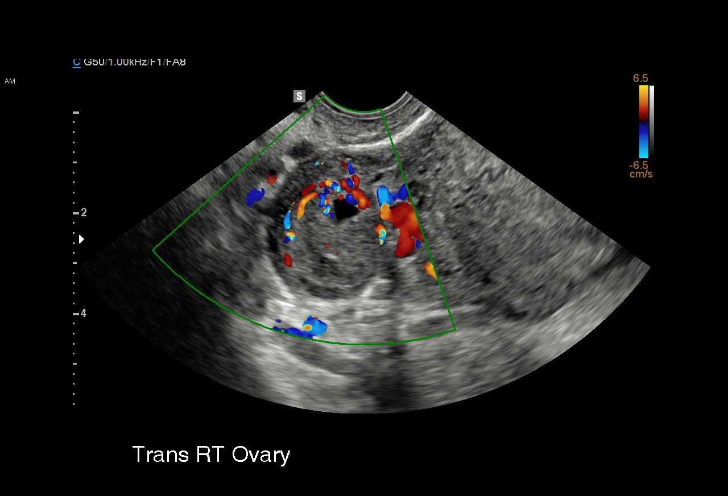
[im 37/53]
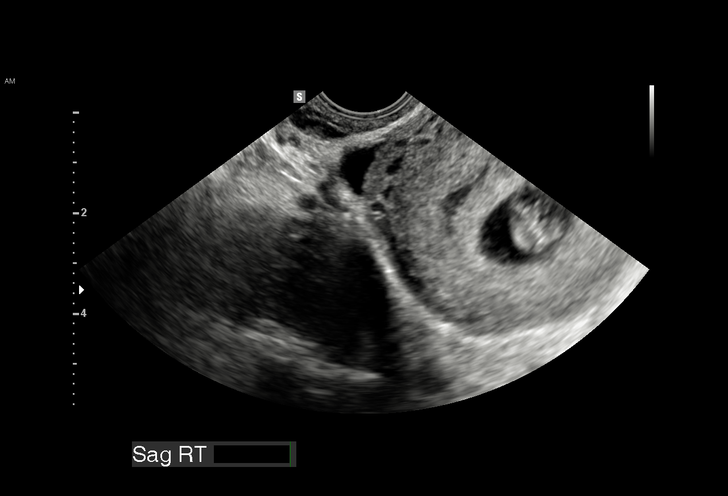
[im 41/53]
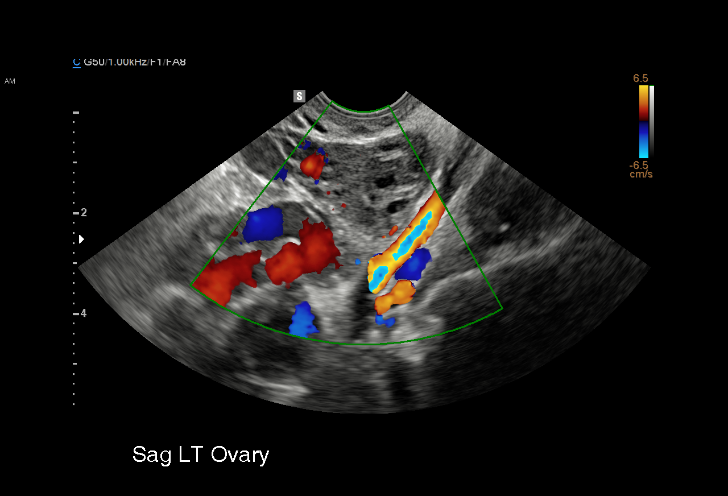
[im 45/53]
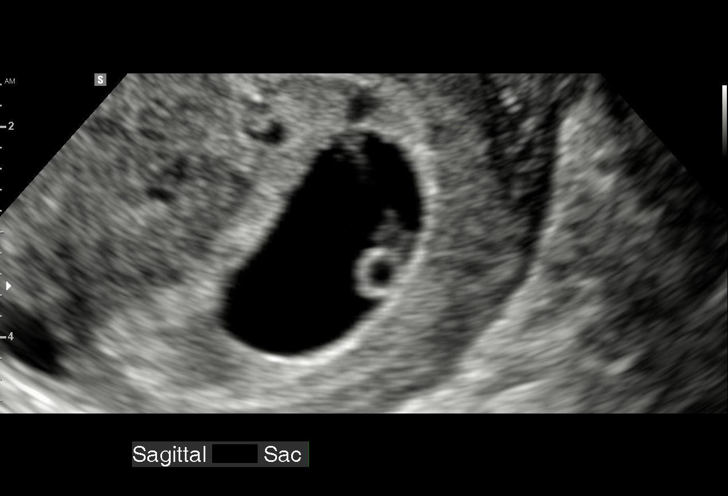
[im 49/53]
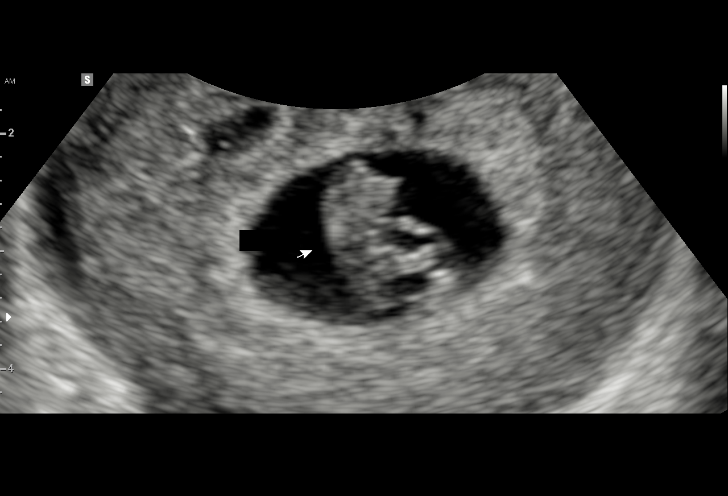
[im 53/53]
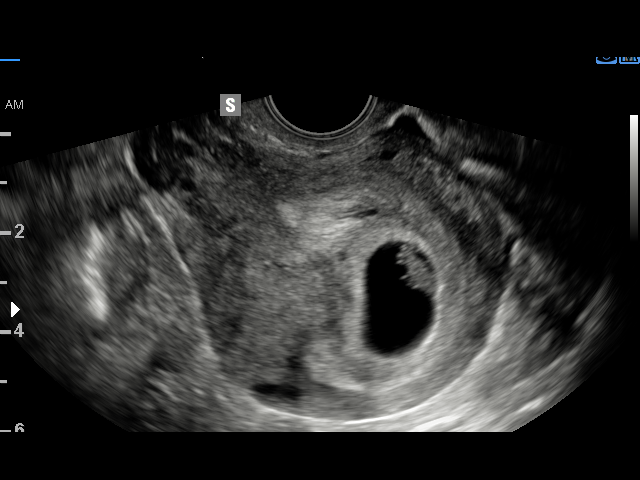

[15 of 28 positions shown; findings below may reference images not displayed]

FINDINGS: Intrauterine gestational sac: Yes

Yolk sac:  Yes

Embryo:  Yes

Cardiac Activity: Yes

Heart Rate: 166  bpm

CRL:  12  mm   7 w   3 d                  US EDC: 12/18/2016

Subchorionic hemorrhage:  None visualized.

Maternal uterus/adnexae: No uterine masses. Normal cervix. Ovaries
are unremarkable. Cystic structure lies adjacent to the right ovary,
which may reflect an exophytic ovarian cyst or a paraovarian cyst.
Adnexa otherwise unremarkable. No abnormal pelvic free fluid.
IMPRESSION: Single live intrauterine pregnancy with a measured gestational age
is 7 weeks he 3 days. No pregnancy complication. No maternal
abnormality is seen to account for pelvic pain.

## 2019-11-19 ENCOUNTER — Emergency Department (HOSPITAL_COMMUNITY)
Admission: EM | Admit: 2019-11-19 | Discharge: 2019-11-19 | Disposition: A | Discharge status: Home / Self Care | Payer: Self-pay | Attending: Emergency Medicine | Admitting: Emergency Medicine

## 2019-11-19 ENCOUNTER — Emergency Department (HOSPITAL_COMMUNITY): Payer: Self-pay

## 2019-11-19 ENCOUNTER — Other Ambulatory Visit: Payer: Self-pay

## 2019-11-19 ENCOUNTER — Encounter (HOSPITAL_COMMUNITY): Payer: Self-pay | Admitting: *Deleted

## 2019-11-19 DIAGNOSIS — Z87891 Personal history of nicotine dependence: Secondary | ICD-10-CM | POA: Insufficient documentation

## 2019-11-19 DIAGNOSIS — N83202 Unspecified ovarian cyst, left side: Secondary | ICD-10-CM | POA: Insufficient documentation

## 2019-11-19 DIAGNOSIS — N760 Acute vaginitis: Secondary | ICD-10-CM | POA: Insufficient documentation

## 2019-11-19 DIAGNOSIS — M545 Low back pain, unspecified: Secondary | ICD-10-CM

## 2019-11-19 DIAGNOSIS — F1210 Cannabis abuse, uncomplicated: Secondary | ICD-10-CM | POA: Insufficient documentation

## 2019-11-19 DIAGNOSIS — B9689 Other specified bacterial agents as the cause of diseases classified elsewhere: Secondary | ICD-10-CM | POA: Insufficient documentation

## 2019-11-19 DIAGNOSIS — R1030 Lower abdominal pain, unspecified: Secondary | ICD-10-CM | POA: Insufficient documentation

## 2019-11-19 DIAGNOSIS — J45909 Unspecified asthma, uncomplicated: Secondary | ICD-10-CM | POA: Insufficient documentation

## 2019-11-19 LAB — WET PREP, GENITAL
Sperm: NONE SEEN
Trich, Wet Prep: NONE SEEN
Yeast Wet Prep HPF POC: NONE SEEN

## 2019-11-19 LAB — URINALYSIS, ROUTINE W REFLEX MICROSCOPIC
Bilirubin Urine: NEGATIVE
Glucose, UA: NEGATIVE mg/dL
Hgb urine dipstick: NEGATIVE
Ketones, ur: NEGATIVE mg/dL
Leukocytes,Ua: NEGATIVE
Nitrite: NEGATIVE
Protein, ur: NEGATIVE mg/dL
Specific Gravity, Urine: >1.03 — ABNORMAL HIGH (ref 1.005–1.030)
pH: 5.5 (ref 5.0–8.0)

## 2019-11-19 LAB — POC URINE PREG, ED: Preg Test, Ur: NEGATIVE

## 2019-11-19 MED ORDER — METRONIDAZOLE 500 MG PO TABS: 500.0000 mg | ORAL_TABLET | Freq: Two times a day (BID) | ORAL | 0 refills | Status: AC

## 2019-11-19 MED ORDER — KETOROLAC TROMETHAMINE 60 MG/2ML IM SOLN
60.0000 mg | Freq: Once | INTRAMUSCULAR | Status: AC
  Administered 2019-11-19: 60 mg via INTRAMUSCULAR
  Filled 2019-11-19: qty 2

## 2019-11-19 MED ORDER — IBUPROFEN 800 MG PO TABS: 800.0000 mg | ORAL_TABLET | Freq: Three times a day (TID) | ORAL | 0 refills | Status: AC | PRN
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
## 2019-11-19 NOTE — ED Notes (Signed)
Provided pt with [place] bag and gingerale

## 2019-11-20 LAB — GC/CHLAMYDIA PROBE AMP (~~LOC~~) NOT AT ARMC
Chlamydia: NEGATIVE
Neisseria Gonorrhea: NEGATIVE

## 2021-06-04 IMAGING — US US PELVIS COMPLETE
1 series · 13 of 25 positions shown · non-contrast
Comparison: None

CLINICAL DATA: Pelvic and back pain

EXAM:
TRANSABDOMINAL AND TRANSVAGINAL ULTRASOUND OF PELVIS
DOPPLER ULTRASOUND OF OVARIES
TECHNIQUE: Both transabdominal and transvaginal ultrasound examinations of the
pelvis were performed. Transabdominal technique was performed for
global imaging of the pelvis including uterus, ovaries, adnexal
regions, and pelvic cul-de-sac.
It was necessary to proceed with endovaginal exam following the
transabdominal exam to visualize the endometrium and uterine fundus.
Color and duplex Doppler ultrasound was utilized to evaluate blood
flow to the ovaries.

[Series 1: us pelvis complete · 13 of 72 slices shown]
[im 1/72]
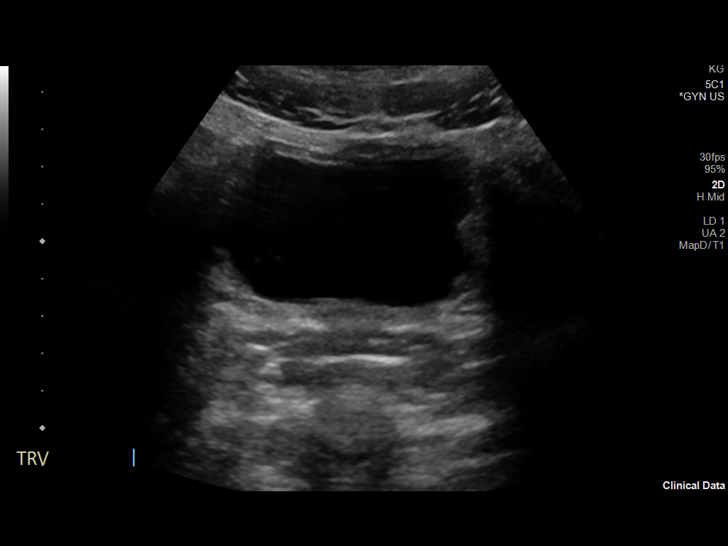
[im 6/72]
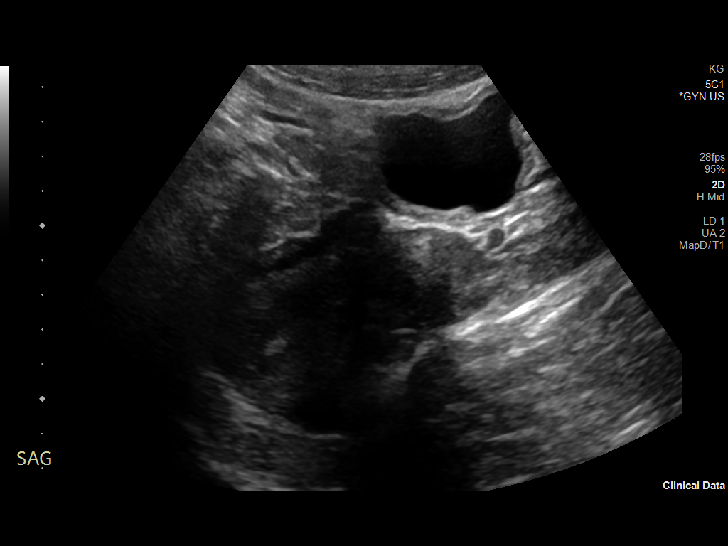
[im 12/72]
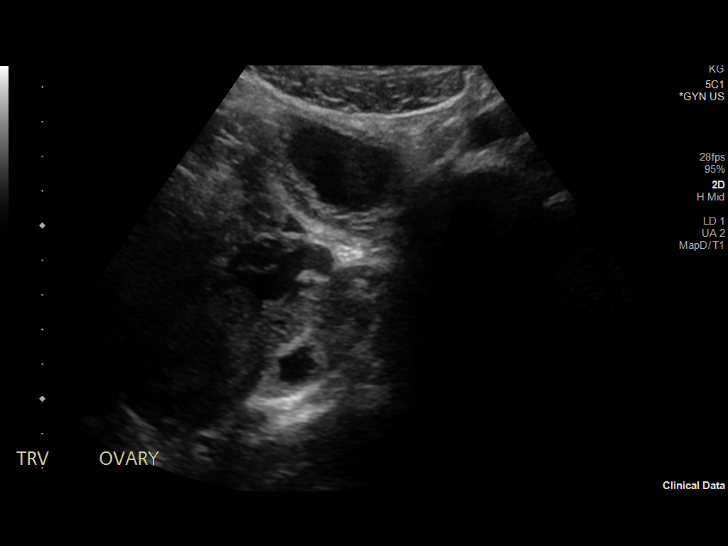
[im 18/72]
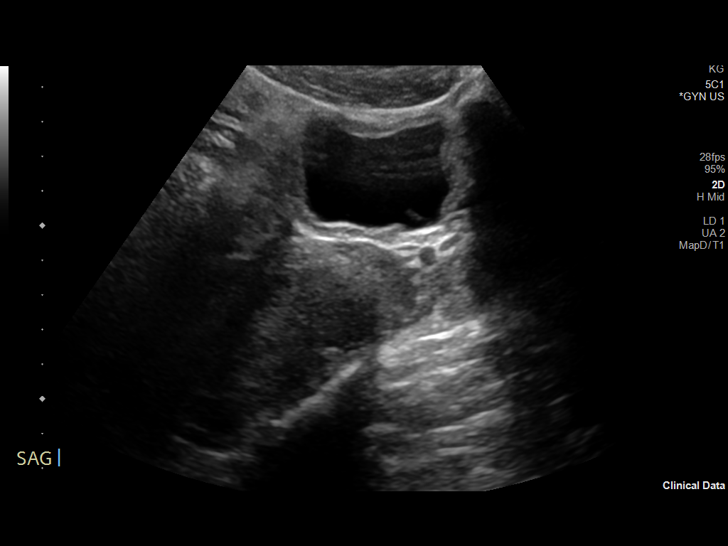
[im 24/72]
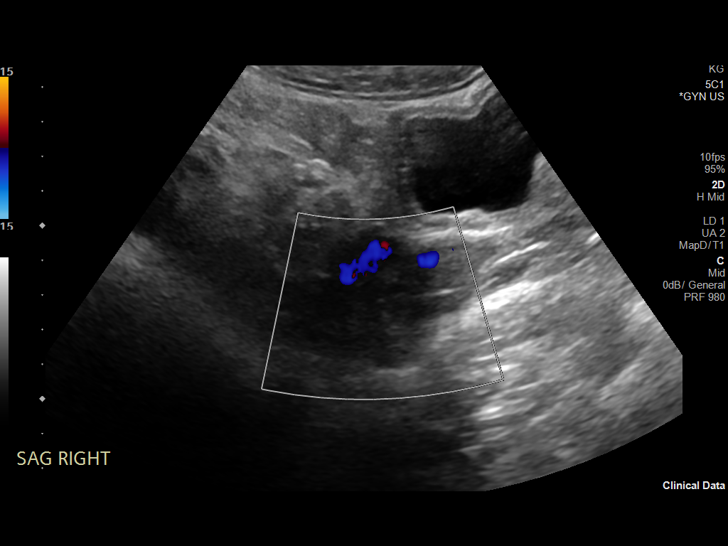
[im 30/72]
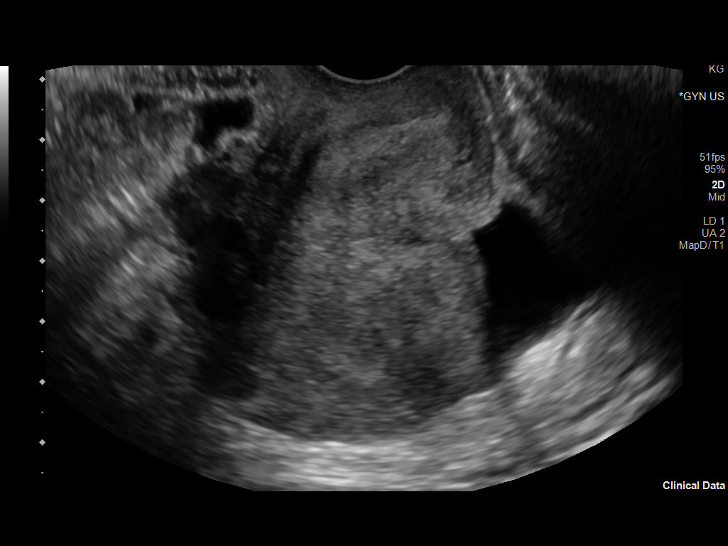
[im 36/72]
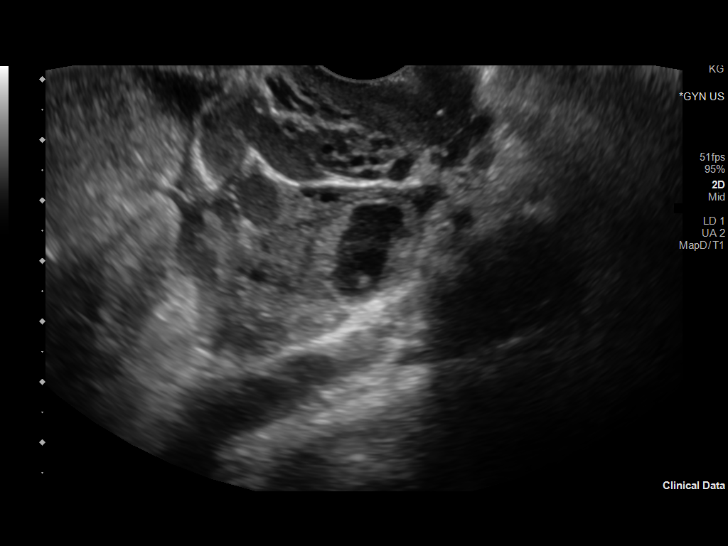
[im 42/72]
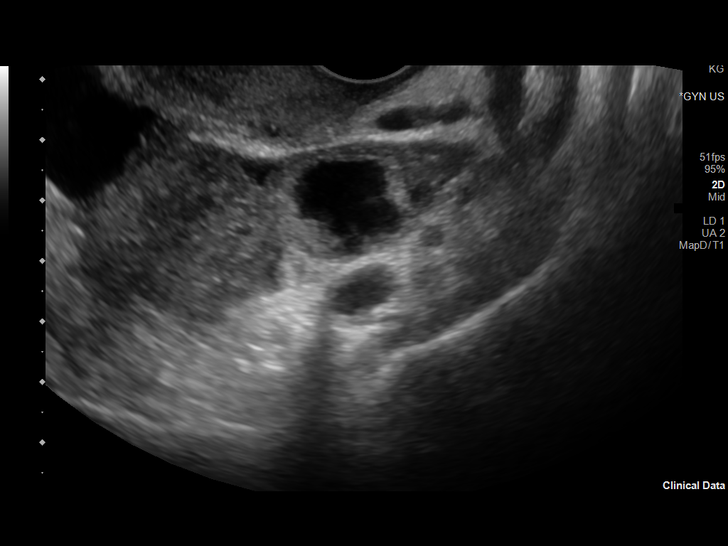
[im 48/72]
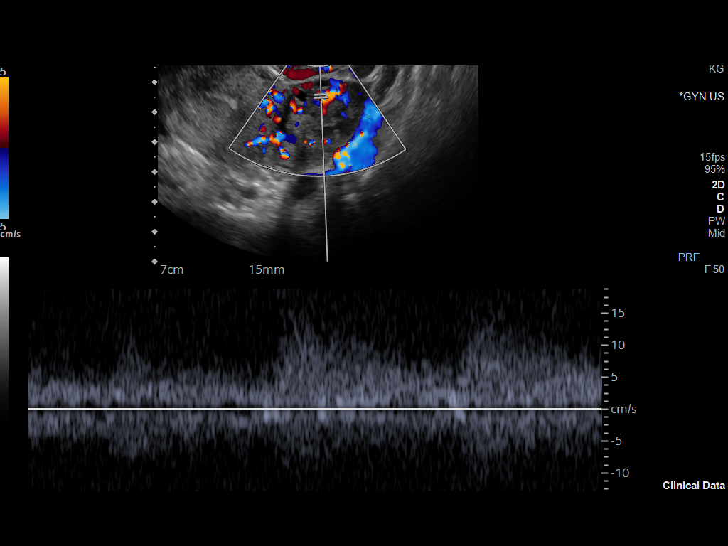
[im 54/72]
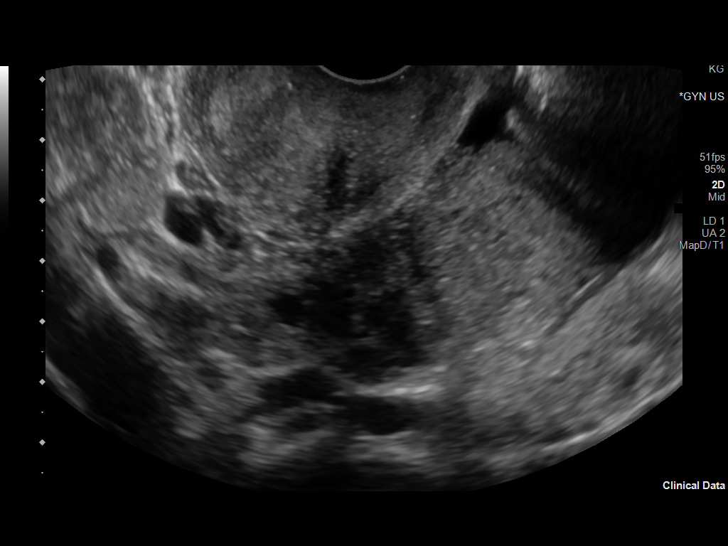
[im 60/72]
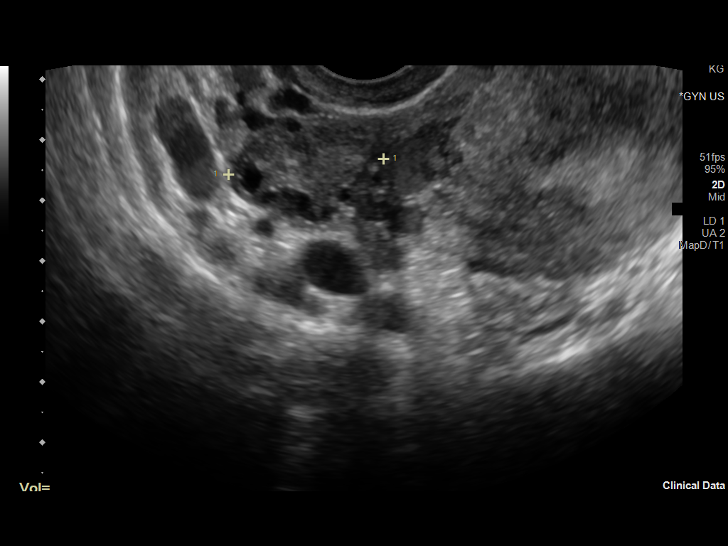
[im 66/72]
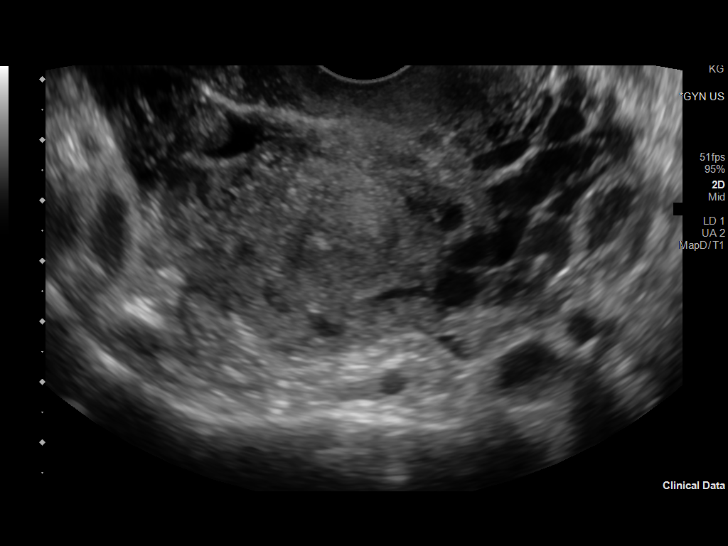
[im 72/72]
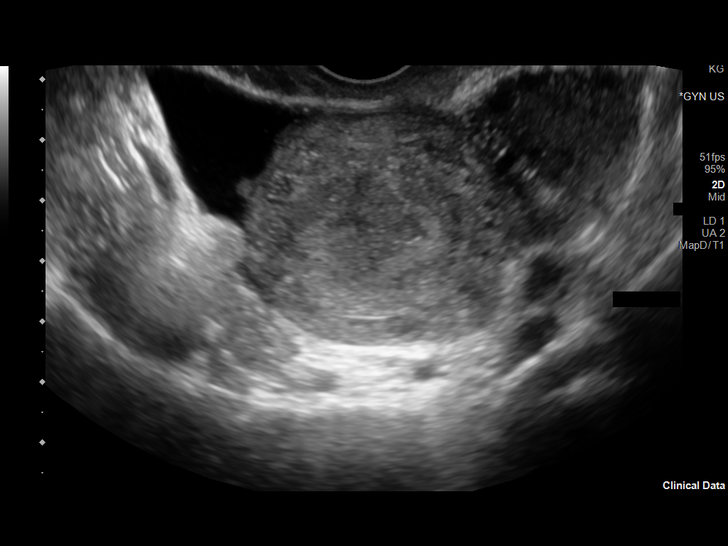

[13 of 25 positions shown; findings below may reference images not displayed]

FINDINGS: Uterus

Measurements: 8.7 x 5.7 x 5.5 cm = volume: 143 mL. Retroflexed.
Normal morphology without mass

Endometrium

Thickness: 3 mm.  No endometrial fluid or focal abnormality

Right ovary

Measurements: 2.6 x 2.8 x 1.7 cm = volume: 6 mL. Normal morphology
without mass. Blood flow present within RIGHT ovary on color Doppler
imaging.

Left ovary

Measurements: 4.1 x 2.6 x 2.9 cm = volume: 1.6 mL. Probable small
hemorrhagic corpus luteum within LEFT ovary 19 x 18 x 21 mm. No
additional masses. Blood flow present within LEFT ovary on color
Doppler imaging.

Pulsed Doppler evaluation of both ovaries demonstrates normal
low-resistance arterial and venous waveforms.

Other findings

Small to moderate amount of nonspecific free pelvic fluid. No
adnexal masses.
IMPRESSION: Small to moderate amount of nonspecific free pelvic fluid.

Probable small hemorrhagic corpus luteum within LEFT ovary.

No additional pelvic/ovarian masses or evidence of ovarian torsion
identified.
